# Patient Record
Sex: Female | Born: 1950
Health system: Southern US, Community
[De-identification: ages and names within clinical notes are randomized; demographics above are authoritative.]

## PROBLEM LIST (undated history)

## (undated) DIAGNOSIS — E7800 Pure hypercholesterolemia, unspecified: Secondary | ICD-10-CM

## (undated) DIAGNOSIS — F329 Major depressive disorder, single episode, unspecified: Secondary | ICD-10-CM

## (undated) DIAGNOSIS — E039 Hypothyroidism, unspecified: Secondary | ICD-10-CM

## (undated) DIAGNOSIS — F32A Depression, unspecified: Secondary | ICD-10-CM

## (undated) DIAGNOSIS — IMO0001 Reserved for inherently not codable concepts without codable children: Secondary | ICD-10-CM

## (undated) DIAGNOSIS — K219 Gastro-esophageal reflux disease without esophagitis: Secondary | ICD-10-CM

## (undated) DIAGNOSIS — I10 Essential (primary) hypertension: Secondary | ICD-10-CM

## (undated) DIAGNOSIS — R87810 Cervical high risk human papillomavirus (HPV) DNA test positive: Principal | ICD-10-CM

## (undated) DIAGNOSIS — M1990 Unspecified osteoarthritis, unspecified site: Secondary | ICD-10-CM

## (undated) DIAGNOSIS — Z8489 Family history of other specified conditions: Secondary | ICD-10-CM

## (undated) DIAGNOSIS — F419 Anxiety disorder, unspecified: Secondary | ICD-10-CM

## (undated) HISTORY — DX: Pure hypercholesterolemia, unspecified: E78.00

## (undated) HISTORY — DX: Cervical high risk human papillomavirus (HPV) DNA test positive: R87.810

## (undated) HISTORY — DX: Gastro-esophageal reflux disease without esophagitis: K21.9

## (undated) HISTORY — PX: HERNIA REPAIR: SHX51

## (undated) HISTORY — DX: Depression, unspecified: F32.A

## (undated) HISTORY — DX: Anxiety disorder, unspecified: F41.9

## (undated) HISTORY — DX: Reserved for inherently not codable concepts without codable children: IMO0001

## (undated) HISTORY — DX: Major depressive disorder, single episode, unspecified: F32.9

---

## 1999-04-29 ENCOUNTER — Ambulatory Visit (HOSPITAL_BASED_OUTPATIENT_CLINIC_OR_DEPARTMENT_OTHER): Admission: RE | Admit: 1999-04-29 | Discharge: 1999-04-29 | Payer: Self-pay | Admitting: General Surgery

## 2001-02-23 ENCOUNTER — Ambulatory Visit (HOSPITAL_COMMUNITY): Admission: RE | Admit: 2001-02-23 | Discharge: 2001-02-23 | Payer: Self-pay | Admitting: Obstetrics and Gynecology

## 2001-02-23 ENCOUNTER — Encounter: Payer: Self-pay | Admitting: Obstetrics and Gynecology

## 2001-06-07 ENCOUNTER — Other Ambulatory Visit: Admission: RE | Admit: 2001-06-07 | Discharge: 2001-06-07 | Payer: Self-pay | Admitting: Obstetrics and Gynecology

## 2002-08-23 ENCOUNTER — Ambulatory Visit (HOSPITAL_COMMUNITY): Admission: RE | Admit: 2002-08-23 | Discharge: 2002-08-23 | Payer: Self-pay | Admitting: Pulmonary Disease

## 2003-05-14 ENCOUNTER — Encounter (HOSPITAL_COMMUNITY): Admission: RE | Admit: 2003-05-14 | Discharge: 2003-06-13 | Payer: Self-pay | Admitting: Pulmonary Disease

## 2003-05-30 ENCOUNTER — Ambulatory Visit (HOSPITAL_COMMUNITY): Admission: RE | Admit: 2003-05-30 | Discharge: 2003-05-30 | Payer: Self-pay | Admitting: *Deleted

## 2003-06-06 ENCOUNTER — Ambulatory Visit (HOSPITAL_COMMUNITY): Admission: RE | Admit: 2003-06-06 | Discharge: 2003-06-06 | Payer: Self-pay | Admitting: *Deleted

## 2003-09-10 ENCOUNTER — Ambulatory Visit (HOSPITAL_COMMUNITY): Admission: RE | Admit: 2003-09-10 | Discharge: 2003-09-10 | Payer: Self-pay | Admitting: Obstetrics and Gynecology

## 2004-10-12 HISTORY — PX: COLONOSCOPY: SHX174

## 2004-11-13 ENCOUNTER — Ambulatory Visit (HOSPITAL_COMMUNITY): Admission: RE | Admit: 2004-11-13 | Discharge: 2004-11-13 | Payer: Self-pay | Admitting: Obstetrics and Gynecology

## 2005-01-02 ENCOUNTER — Ambulatory Visit: Payer: Self-pay | Admitting: Internal Medicine

## 2005-01-02 ENCOUNTER — Ambulatory Visit (HOSPITAL_COMMUNITY): Admission: RE | Admit: 2005-01-02 | Discharge: 2005-01-02 | Payer: Self-pay | Admitting: Internal Medicine

## 2006-01-26 ENCOUNTER — Ambulatory Visit (HOSPITAL_COMMUNITY): Admission: RE | Admit: 2006-01-26 | Discharge: 2006-01-26 | Payer: Self-pay | Admitting: Obstetrics and Gynecology

## 2006-06-09 ENCOUNTER — Ambulatory Visit: Payer: Self-pay | Admitting: Orthopedic Surgery

## 2006-06-21 ENCOUNTER — Ambulatory Visit: Payer: Self-pay | Admitting: Orthopedic Surgery

## 2007-04-08 ENCOUNTER — Ambulatory Visit (HOSPITAL_COMMUNITY): Admission: RE | Admit: 2007-04-08 | Discharge: 2007-04-08 | Payer: Self-pay | Admitting: Pulmonary Disease

## 2007-10-19 ENCOUNTER — Other Ambulatory Visit: Admission: RE | Admit: 2007-10-19 | Discharge: 2007-10-19 | Payer: Self-pay | Admitting: Obstetrics and Gynecology

## 2008-02-14 ENCOUNTER — Ambulatory Visit: Payer: Self-pay | Admitting: Orthopedic Surgery

## 2008-02-14 DIAGNOSIS — M79609 Pain in unspecified limb: Secondary | ICD-10-CM | POA: Insufficient documentation

## 2008-05-08 ENCOUNTER — Ambulatory Visit (HOSPITAL_COMMUNITY): Admission: RE | Admit: 2008-05-08 | Discharge: 2008-05-08 | Payer: Self-pay | Admitting: Pulmonary Disease

## 2008-06-19 ENCOUNTER — Ambulatory Visit (HOSPITAL_COMMUNITY): Admission: RE | Admit: 2008-06-19 | Discharge: 2008-06-19 | Payer: Self-pay | Admitting: Otolaryngology

## 2008-10-15 ENCOUNTER — Ambulatory Visit (HOSPITAL_COMMUNITY): Admission: RE | Admit: 2008-10-15 | Discharge: 2008-10-15 | Payer: Self-pay | Admitting: Pulmonary Disease

## 2008-11-02 ENCOUNTER — Other Ambulatory Visit: Admission: RE | Admit: 2008-11-02 | Discharge: 2008-11-02 | Payer: Self-pay | Admitting: Obstetrics & Gynecology

## 2009-07-09 ENCOUNTER — Ambulatory Visit (HOSPITAL_COMMUNITY): Admission: RE | Admit: 2009-07-09 | Discharge: 2009-07-09 | Payer: Self-pay | Admitting: Pulmonary Disease

## 2009-12-04 ENCOUNTER — Other Ambulatory Visit: Admission: RE | Admit: 2009-12-04 | Discharge: 2009-12-04 | Payer: Self-pay | Admitting: Obstetrics & Gynecology

## 2010-08-19 ENCOUNTER — Ambulatory Visit (HOSPITAL_COMMUNITY): Admission: RE | Admit: 2010-08-19 | Discharge: 2010-08-19 | Payer: Self-pay | Admitting: Obstetrics and Gynecology

## 2010-11-03 ENCOUNTER — Encounter: Payer: Self-pay | Admitting: Pulmonary Disease

## 2010-11-27 ENCOUNTER — Ambulatory Visit (INDEPENDENT_AMBULATORY_CARE_PROVIDER_SITE_OTHER): Payer: 59 | Admitting: Internal Medicine

## 2010-11-27 ENCOUNTER — Other Ambulatory Visit (INDEPENDENT_AMBULATORY_CARE_PROVIDER_SITE_OTHER): Payer: Self-pay | Admitting: Internal Medicine

## 2010-11-27 DIAGNOSIS — R1310 Dysphagia, unspecified: Secondary | ICD-10-CM

## 2010-11-28 ENCOUNTER — Encounter (HOSPITAL_COMMUNITY): Payer: Self-pay

## 2010-11-28 ENCOUNTER — Ambulatory Visit (HOSPITAL_COMMUNITY)
Admission: RE | Admit: 2010-11-28 | Discharge: 2010-11-28 | Disposition: A | Discharge status: Home / Self Care | Payer: 59 | Source: Ambulatory Visit | Attending: Internal Medicine | Admitting: Internal Medicine

## 2010-11-28 DIAGNOSIS — R1310 Dysphagia, unspecified: Secondary | ICD-10-CM | POA: Insufficient documentation

## 2010-11-28 HISTORY — DX: Essential (primary) hypertension: I10

## 2010-12-16 ENCOUNTER — Other Ambulatory Visit: Payer: Self-pay | Admitting: Obstetrics & Gynecology

## 2010-12-16 ENCOUNTER — Other Ambulatory Visit (HOSPITAL_COMMUNITY)
Admission: RE | Admit: 2010-12-16 | Discharge: 2010-12-16 | Disposition: A | Discharge status: Home / Self Care | Payer: 59 | Source: Ambulatory Visit | Attending: Obstetrics & Gynecology | Admitting: Obstetrics & Gynecology

## 2010-12-16 DIAGNOSIS — Z01419 Encounter for gynecological examination (general) (routine) without abnormal findings: Secondary | ICD-10-CM | POA: Insufficient documentation

## 2010-12-17 ENCOUNTER — Encounter (HOSPITAL_BASED_OUTPATIENT_CLINIC_OR_DEPARTMENT_OTHER): Payer: 59 | Admitting: Internal Medicine

## 2010-12-17 ENCOUNTER — Ambulatory Visit (HOSPITAL_COMMUNITY)
Admission: RE | Admit: 2010-12-17 | Discharge: 2010-12-17 | Disposition: A | Discharge status: Home / Self Care | Payer: 59 | Source: Ambulatory Visit | Attending: Internal Medicine | Admitting: Internal Medicine

## 2010-12-17 DIAGNOSIS — K449 Diaphragmatic hernia without obstruction or gangrene: Secondary | ICD-10-CM | POA: Insufficient documentation

## 2010-12-17 DIAGNOSIS — Z79899 Other long term (current) drug therapy: Secondary | ICD-10-CM | POA: Insufficient documentation

## 2010-12-17 DIAGNOSIS — E119 Type 2 diabetes mellitus without complications: Secondary | ICD-10-CM | POA: Insufficient documentation

## 2010-12-17 DIAGNOSIS — R1310 Dysphagia, unspecified: Secondary | ICD-10-CM | POA: Insufficient documentation

## 2010-12-17 DIAGNOSIS — K222 Esophageal obstruction: Secondary | ICD-10-CM | POA: Insufficient documentation

## 2010-12-17 DIAGNOSIS — I10 Essential (primary) hypertension: Secondary | ICD-10-CM | POA: Insufficient documentation

## 2010-12-18 LAB — GLUCOSE, CAPILLARY: Glucose-Capillary: 116 mg/dL — ABNORMAL HIGH (ref 70–99)

## 2011-01-12 NOTE — Consult Note (Signed)
[name], [name]           ACCOUNT NO.:  [id number]  MEDICAL RECORD NO.:  [id number]           PATIENT TYPE:  LOCATION:Office Visit                                 FACILITY:  PHYSICIAN:  Lionel December, M.D.    DATE OF BIRTH:  1951-06-13  DATE OF CONSULTATION: DATE OF DISCHARGE:                                CONSULTATION   REASON FOR CONSULTATION:  Dysphagia.  HISTORY OF PRESENT ILLNESS:  [name] is a 60 year old female referred to our office by Dr. Juanetta Gosling for dysphagia.  She states that breads and sandwiches or giving her problems.  They are lodging in her esophagus.  She is noticing this about once a week.  Meats and pills are not lodging.  She does state that she has a habit of coughing her food back up. She denies any acid reflux.  In September 2009, she underwent barium swallow for dysphagia, which revealed a small hiatal hernia, GE reflux, and no stricture.  Her last colonoscopy was in 2006, which revealed small external hemorrhoids, otherwise normal colonoscopy.  LABORATORY DATA:  Sodium 138, potassium 4.4, chloride 101, glucose 108, BUN 10, creatinine 0.58, total bilirubin was 0.7, direct was 0.2, indirect 0.5, ALP 77, AST 16, ALT 16, total protein 6.5, albumin 4.2, and her hemoglobin A1c was 6.0.  She is allergic to PENICILLIN.  HOME MEDICATIONS: 1. Glyburide/metformin 5/500 twice a day. 2. Avapro one a day. 3. Lorazepam 2 mg as needed. 4. Bupropion 150 mg a day. 5. Benzonatate 100 mg as needed. 6. Hydrochlorothiazide 25 mg a day. 7. Lipitor 40 mg a day. 8. Victoza 18 units in the morning.  SURGERIES:  She has had a hernia repair 20 years ago.  She had a tonsillectomy, and she has had some precancerous skin lesions removed.  MEDICAL HISTORY:  She has been a diabetic to 15 years.  She has hypertension and acid reflux.  FAMILY HISTORY:  Mother is alive with diabetes in good health.  Father is deceased from prostate cancer.  One sister who  recently had an atrial valve repair.  She has three brothers and all have arthritis and hypertension.  She is married.  She is retired from [employer], site. She occasionally smokes about twice a week.  No alcohol and no children.  OBJECTIVE:  VITAL SIGNS:  Her weight is 216, her height is 5 feet 6 inches, her temperature is 97.3,  her blood pressure is 136/70, her pulse is 70. HEENT:  She has natural teeth.  Her oral mucosa is moist.  There are no lesions.  Her conjunctivae are pink.  Sclerae are anicteric.  Thyroid is normal.  There is no cervical lymphadenopathy. LUNGS:  Clear. HEART:  Regular rate and rhythm. ABDOMEN:  Soft, obese.  Bowel sounds are positive.  No masses and no tenderness.  ASSESSMENT:  [name] is a 60 year old female presenting with dysphagia to solids and particularly breads.  She has had this problem  in the past.   RECOMMENDATIONS:  We will schedule a barium pill study in the near future.  We will start her on omeprazole 40 mg daily.  If her barium swallow  is normal and she is still having symptoms, I have advised her that Dr.Cicero Noy  would proceed with an EGD.  Thank you for allowing [place] to participate in her care.    ______________________________ Dorene Ar, NP   ______________________________ Lionel December, M.D.    TS/MEDQ  D:  11/27/2010  T:  11/28/2010  Job:  [id number]  cc:   Dr. Juanetta Gosling  Electronically Signed by Dorene Ar PA on 12/17/2010 10:37:50 AM Electronically Signed by Lionel December M.D. on 01/12/2011 10:00:06 AM

## 2011-01-12 NOTE — Op Note (Signed)
  [name], [name]           ACCOUNT NO.:  [id number]  MEDICAL RECORD NO.:  [id number]           PATIENT TYPE:  O  LOCATION:  DAYP                          FACILITY:  [facility]  PHYSICIAN:  Lionel December, M.D.    DATE OF BIRTH:  May 21, 1951  DATE OF PROCEDURE:  12/17/2010 DATE OF DISCHARGE:                              OPERATIVE REPORT   PROCEDURE:  Esophagogastroduodenoscopy with esophageal dilation.  INDICATION:  [name] is 60 year old Caucasian female who has been experiencing intermittent solid food dysphagia over the last few months. She denies heartburn, odynophagia.  She is undergoing diagnostic/therapeutic EGD.  Procedures were reviewed with the patient. Informed consent was obtained.  MEDS FOR CONSCIOUS SEDATION:  Cetacaine spray for oropharyngeal topical anesthesia, Demerol 50 mg IV, Versed 7 mg IV in divided dose.  FINDINGS:  Procedure performed in endoscopy suite.  The patient's vital signs and O2 sat were monitored during the procedure and remained stable.  The patient was placed in left lateral recumbent position and Pentax videoscope was passed through oropharynx without any difficulty into esophagus.  Mucosa of the esophagus was normal.  Schatzki's ring noted.  GE junction was located at 34 cm from the incisors and hiatus was at 36.  No erosions were noted.  Stomach.  It was empty and distended very well by insufflation.  Folds of proximal stomach are normal.  Examination of mucosa at body, antrum, pyloric channel, as well as angularis, fundus, and cardia was normal.  Duodenum.  Bulbar mucosa was normal.  Scope was passed in second part of the duodenum where mucosa and folds were normal.  Endoscope was withdrawn.  Esophageal dilation was performed by passing 54-French Maloney dilator to full insertion.  As the dilator was withdrawn, endoscope was passed again.  There was a small linear tear to cervical esophagus, but the ring was intact and therefore  disrupted with four quadrant biopsy.  No tissue was saved.  Stomach was decompressed and endoscope was withdrawn. The patient tolerated the procedure well.  FINAL DIAGNOSES: 1. Schatzki's ring. 2. A 2-cm size sliding hiatal hernia.  Esophagus dilated by passing 54-French Maloney dilator resulting in linear mucosal disruption at cervical esophagus, but ring was intact and therefore disrupted with focal biopsy as described above.  RECOMMENDATIONS: 1. The patient advised to chew her food thoroughly before she     swallows. 2. She will call us with a progress report in 1 week.     Lionel December, M.D.     NR/MEDQ  D:  12/17/2010  T:  12/17/2010  Job:  [id number]  cc:   Dr. Juanetta Gosling  Electronically Signed by Lionel December M.D. on 01/12/2011 10:00:26 AM

## 2011-02-27 NOTE — Op Note (Signed)
[name], [name]           ACCOUNT NO.:  [id number]   MEDICAL RECORD NO.:  [id number]          PATIENT TYPE:  AMB   LOCATION:  DAY                           FACILITY:  [facility]   PHYSICIAN:  Lionel December, M.D.    DATE OF BIRTH:  November 04, 1950   DATE OF PROCEDURE:  01/02/2005  DATE OF DISCHARGE:                                 OPERATIVE REPORT   PROCEDURE:  Total colonoscopy.   INDICATION:  [name] is a 60 year old Caucasian female who is here  for screening colonoscopy. Family history is negative for CRC. Procedure  risks were reviewed the patient, and informed consent was obtained.   PREMEDICATION:  Demerol 25 mg IV, Versed 5 mg IV.   FINDINGS:  Procedure performed in endoscopy suite. The patient's vital signs  and O2 saturation were monitored during procedure and remained stable. The  patient was placed left lateral position and rectal examination performed.  No abnormality noted on external or digital exam. Olympus videoscope was  placed in rectum and restoration into sigmoid colon and beyond. Preparation  was excellent, except she had some stool in the hepatic flexure which was  washed away. Scope was passed to cecum which was identified by appendiceal  orifice and ileocecal valve. Short segment of TI was also examined and was  normal. Pictures taken for the record. As the scope was withdrawn, colonic  mucosa was examined for the second time and was normal throughout. Rectal  mucosa similarly was normal. Scope was retroflexed to examine anorectal  junction, and small hemorrhoids noted below the dentate line. Endoscope was  straightened and withdrawn. The patient tolerated the procedure well.   FINAL DIAGNOSIS:  Small external hemorrhoids, otherwise normal colonoscopy.   RECOMMENDATIONS:  1.  She will resume her usual diet.  2.  She should continue yearly Hemoccults and consider next screening exam      in 10 years from now.      NR/MEDQ  D:  01/02/2005  T:   01/02/2005  Job:  [id number]

## 2011-02-27 NOTE — Procedures (Signed)
   [name], [name]                       ACCOUNT NO.:  [id number]   MEDICAL RECORD NO.:  [id number]                   PATIENT TYPE:  PREC   LOCATION:                                       FACILITY:   PHYSICIAN:  Edward L. Juanetta Gosling, M.D.             DATE OF BIRTH:   DATE OF PROCEDURE:  05/14/2003  DATE OF DISCHARGE:                                    STRESS TEST   PROCEDURE:  Stress test.   [name] has been having chest discomfort.  She also has a history of  hypertension.  She is undergoing graded exercise testing to rule out  ischemic cardiac disease.  There are no contraindications to graded exercise  testing.   DESCRIPTION OF PROCEDURE:  [name] exercised for 7 minutes and 13  seconds on the Bruce Protocol reaching, and sustaining, 10.1 METS.   Cardiolite was injected per protocol.  Her blood pressure response to  exercise was normal.  She had no symptoms during exercise.  She has no  electrocardiographic changes suggestive of inducible ischemia.   IMPRESSION:  1. Good exercise tolerance.  2. Normal blood pressure response to exercise.  3. No evidence of inducible ischemia.  4. No symptoms on exercise.  5. Cardiolite images are pending.                                               Edward L. Juanetta Gosling, M.D.    ELH/MEDQ  D:  05/14/2003  T:  05/14/2003  Job:  [id number]

## 2011-02-27 NOTE — Cardiovascular Report (Signed)
NAME:  [name], FONT                     ACCOUNT NO.:  [id number]   MEDICAL RECORD NO.:  [id number]                   PATIENT TYPE:  OIB   LOCATION:  2876                                 FACILITY:  [facility]   PHYSICIAN:  Vida Roller, M.D.                DATE OF BIRTH:  07-14-1951   DATE OF PROCEDURE:  06/06/2003  DATE OF DISCHARGE:  06/06/2003                              CARDIAC CATHETERIZATION   REFERRING PHYSICIAN:  Ramon Dredge L. Juanetta Gosling, M.D.   REASON FOR EVALUATION:  This is a 60 year old woman with hypertension and  hyperlipidemia who presents with atypical chest discomfort.  Had a perfusion  study which showed anterior ischemia which could not be ruled out for breast  attenuation and she was referred for elective heart catheterization via the  radial approach.   PROCEDURE PERFORMED:  1. Left heart catheterization.  2. Selective coronary angiography.  3. Left ventriculography.   DETAILS OF THE PROCEDURE:  After obtaining informed consent, the patient was  brought to the cardiac catheterization laboratory in the fasting state.  There she was prepped and draped in the usual manner and local anesthetic  was obtained over the right wrist using 1% lidocaine without epinephrine.  The right radial artery was cannulated using the modified Seldinger  technique with a 6-French 10 cm sheath and left heart catheterization was  performed using a 6-French Judkins left #3.5 and a 6-French Judkins right  #4.0.  Pigtail catheter was used for the left ventriculogram which was  imaged in the 30 degree RAO view.  At the conclusion of the procedure the  catheters were removed.  The sheath was removed.  A Radstat device was  placed for hemostasis.  The patient was moved back to the cardiology holding  area in good condition.  Total fluoroscopic time was 15.7 minutes.  Total  ionized contrast was 100 mL.   RESULTS:  Aortic pressure was 110/70 with a mean arterial pressure 85.   Left  ventricular pressure was 105/8 with an end-diastolic pressure of 16  mmHg.   CORONARY ANGIOGRAPHY:  The left main coronary artery is a normal sized  artery with no angiographic disease.   The left anterior descending coronary artery is a large transapical vessel  with three diagonal branches all of which are moderate caliber.  There is no  angiographic disease.   There is a ramus intermedius artery which is small and has no angiographic  disease.   The left circumflex coronary artery is a moderate caliber vessel with two  obtuse marginals in the posterolateral branch all of which are free of  disease.   The right coronary artery is a large dominant vessel with a small  posterolateral branch and a moderate caliber posterior descending coronary  artery, but is completely free of disease.   LEFT VENTRICULOGRAM:  Left ventriculogram reveals an ejection fraction of  60% with no wall motion abnormalities, no mitral regurgitation.  IMPRESSION:  1. Normal coronaries.  2. Normal left ventricular function.   PLAN:  Medical therapy and risk factor modification.                                               Vida Roller, M.D.    JH/MEDQ  D:  06/06/2003  T:  06/07/2003  Job:  [id number]

## 2011-04-21 ENCOUNTER — Ambulatory Visit (HOSPITAL_COMMUNITY)
Admission: RE | Admit: 2011-04-21 | Discharge: 2011-04-21 | Disposition: A | Discharge status: Home / Self Care | Payer: 59 | Source: Ambulatory Visit | Attending: Pulmonary Disease | Admitting: Pulmonary Disease

## 2011-04-21 ENCOUNTER — Other Ambulatory Visit (HOSPITAL_COMMUNITY): Payer: Self-pay | Admitting: Pulmonary Disease

## 2011-04-21 DIAGNOSIS — M899 Disorder of bone, unspecified: Secondary | ICD-10-CM | POA: Insufficient documentation

## 2011-04-21 DIAGNOSIS — M25559 Pain in unspecified hip: Secondary | ICD-10-CM | POA: Insufficient documentation

## 2011-04-21 DIAGNOSIS — R52 Pain, unspecified: Secondary | ICD-10-CM

## 2011-11-06 ENCOUNTER — Other Ambulatory Visit (HOSPITAL_COMMUNITY): Payer: Self-pay | Admitting: Pulmonary Disease

## 2011-11-06 DIAGNOSIS — Z139 Encounter for screening, unspecified: Secondary | ICD-10-CM

## 2011-11-10 ENCOUNTER — Ambulatory Visit (HOSPITAL_COMMUNITY)
Admission: RE | Admit: 2011-11-10 | Discharge: 2011-11-10 | Disposition: A | Discharge status: Home / Self Care | Payer: BC Managed Care – PPO | Source: Ambulatory Visit | Attending: Pulmonary Disease | Admitting: Pulmonary Disease

## 2011-11-10 DIAGNOSIS — Z139 Encounter for screening, unspecified: Secondary | ICD-10-CM

## 2011-11-10 DIAGNOSIS — Z1231 Encounter for screening mammogram for malignant neoplasm of breast: Secondary | ICD-10-CM | POA: Insufficient documentation

## 2011-12-23 ENCOUNTER — Other Ambulatory Visit (HOSPITAL_COMMUNITY)
Admission: RE | Admit: 2011-12-23 | Discharge: 2011-12-23 | Disposition: A | Discharge status: Home / Self Care | Payer: BC Managed Care – PPO | Source: Ambulatory Visit | Attending: Obstetrics & Gynecology | Admitting: Obstetrics & Gynecology

## 2011-12-23 ENCOUNTER — Other Ambulatory Visit: Payer: Self-pay | Admitting: Obstetrics & Gynecology

## 2011-12-23 DIAGNOSIS — Z01419 Encounter for gynecological examination (general) (routine) without abnormal findings: Secondary | ICD-10-CM | POA: Insufficient documentation

## 2012-02-02 ENCOUNTER — Other Ambulatory Visit (HOSPITAL_COMMUNITY): Payer: Self-pay | Admitting: Pulmonary Disease

## 2012-02-02 DIAGNOSIS — M545 Low back pain, unspecified: Secondary | ICD-10-CM

## 2012-02-02 DIAGNOSIS — M5410 Radiculopathy, site unspecified: Secondary | ICD-10-CM

## 2012-02-03 ENCOUNTER — Ambulatory Visit (HOSPITAL_COMMUNITY)
Admission: RE | Admit: 2012-02-03 | Discharge: 2012-02-03 | Disposition: A | Discharge status: Home / Self Care | Payer: BC Managed Care – PPO | Source: Ambulatory Visit | Attending: Pulmonary Disease | Admitting: Pulmonary Disease

## 2012-02-03 DIAGNOSIS — M47817 Spondylosis without myelopathy or radiculopathy, lumbosacral region: Secondary | ICD-10-CM | POA: Insufficient documentation

## 2012-02-03 DIAGNOSIS — M545 Low back pain, unspecified: Secondary | ICD-10-CM

## 2012-02-03 DIAGNOSIS — M79609 Pain in unspecified limb: Secondary | ICD-10-CM | POA: Insufficient documentation

## 2012-02-03 DIAGNOSIS — M5410 Radiculopathy, site unspecified: Secondary | ICD-10-CM

## 2012-02-04 ENCOUNTER — Other Ambulatory Visit: Payer: Self-pay | Admitting: *Deleted

## 2012-02-04 ENCOUNTER — Encounter: Payer: Self-pay | Admitting: Orthopedic Surgery

## 2012-02-04 ENCOUNTER — Ambulatory Visit (INDEPENDENT_AMBULATORY_CARE_PROVIDER_SITE_OTHER): Payer: BC Managed Care – PPO | Admitting: Orthopedic Surgery

## 2012-02-04 VITALS — BP 96/64 | Ht 65.0 in | Wt 213.0 lb

## 2012-02-04 DIAGNOSIS — M5126 Other intervertebral disc displacement, lumbar region: Secondary | ICD-10-CM

## 2012-02-04 DIAGNOSIS — M47817 Spondylosis without myelopathy or radiculopathy, lumbosacral region: Secondary | ICD-10-CM

## 2012-02-04 DIAGNOSIS — M48061 Spinal stenosis, lumbar region without neurogenic claudication: Secondary | ICD-10-CM

## 2012-02-04 DIAGNOSIS — M549 Dorsalgia, unspecified: Secondary | ICD-10-CM

## 2012-02-04 NOTE — Progress Notes (Signed)
CONSULT REQUEST BY DR Juanetta Gosling Subjective:    [name] is a 61 y.o. female who presents for evaluation of low back pain. The patient has had no prior back problems. Symptoms have been present for 8 months and are gradually worsening.  Onset was related to / precipitated by no known injury. The pain is located in the right lumbar area, right gluteal area or radiating to right leg(s) and radiates to the right hip, right thigh, right lower leg, right foot. The pain is described as aching, burning, stabbing, throbbing and tingling and occurs all day. . Symptoms are exacerbated by lying down, sitting, standing and walking for more than a few minutes. Symptoms are improved by change in body position, heat, ice and narcotic pain medications. She has also tried acetaminophen and rest which provided no symptom relief. She has weakness in the right leg, tingling in the right leg and burning pain in the right leg associated with the back pain. The patient has no "red flag" history indicative of complicated back pain.  The following portions of the patient's history were reviewed and updated as appropriate: allergies, current medications, past family history, past medical history, past social history, past surgical history and problem list.  Review of Systems Pertinent items are noted in HPI.    Objective:   Inspection and palpation: lordosis noted , spinal tenderness noted L5-S1, paraspinal tenderness noted right lower back . Muscle tone and ROM exam: muscle tone normal without spasm, limited range of motion with pain. Straight leg raise: positive at 60 degrees on the right. Neurological: normal DTRs, muscle strength and reflexes.   Imaging: MRI spinal stenosis  Assessment:    Spinal stenosis    Plan:    Natural history and expected course discussed. Questions answered. Neurosurgery referral due to weakness in the right leg .

## 2012-02-04 NOTE — Patient Instructions (Addendum)
Neurosurgery referral for IMPRESSION:  Multilevel lumbar spondylosis and stenosis as described above.  Multilevel facet arthritis and arthrosis. Central stenosis most  pronounced at L4-L5, with bilateral lateral recess stenosis.  MRI REPORT:   L1-L2: Disc degeneration. Right lateral protrusion. Right facet  arthritis with small effusion. Circumferential disc bulges present  with mild central stenosis. Right eccentric broad-based posterior  disc bulge.  L2-L3: Circumferential disc bulge. Central canal shows minimal  narrowing associated with broad-based posterior bulging. Right  greater than left facet arthritis is present. Small right facet  effusion. Left greater than right lateral recess stenosis  associated with bulging disc. Mild left foraminal stenosis.  L3-L4: Degenerated and desiccated disc with shallow broad-based  posterior disc bulge. Right facet arthritis and left facet  arthrosis. Mild central stenosis is multifactorial, associated  with disc, facet hypertrophy and ligamentum flavum redundancy.  Mild bilateral foraminal stenosis is multifactorial.  L4-L5: Severe bilateral facet arthrosis. Moderate central  stenosis. Bilateral subarticular lateral recess stenosis. Mild  symmetric bilateral foraminal stenosis associated with  anterolisthesis, uncoverage of the disc and facet arthrosis. Left  facet arthritis is present with facet effusion and periarticular  edema.  L5-S1: Relative preservation of disc height and signal. Central  canal and foramina are patent. Left facet arthritis and right  facet arthrosis. Left lateral recess stenosis is present  associated with hypertrophic facet changes, potentially affecting  the descending left S1 nerve.  IMPRESSION:  Multilevel lumbar spondylosis and stenosis as described above.  Multilevel facet arthritis and arthrosis. Central stenosis most  pronounced at L4-L5, with bilateral lateral recess stenosis.  Original Report  Authenticated By: Andreas Newport, M.D.    Spinal Stenosis One cause of back pain is spinal stenosis. Stenosis means abnormal narrowing. The spinal canal contains and protects the spinal nerve roots. In spinal stenosis, the spinal canal narrows and pinches the spinal cord and nerves. This causes low back pain and pain in the legs. Stenosis may pinch the nerves that control muscles and sensation in the legs. This leads to pain and abnormal feelings in the leg muscles and areas supplied by those nerves. CAUSES   Spinal stenosis often happens to people as they get older and arthritic boney growths occur in their spinal canal. There is also a loss of the disk height between the bones of the back, which also adds to this problem. Sometimes the problem is present at birth. SYMPTOMS    Pain that is generally worse with activities, particularly standing and walking.   Numbness, tingling, hot or cold feelings, weakness, or a weariness in the legs.   Clumsiness, frequent falling, and a foot-slapping gait, which may come as a result of nerve pressure and muscle weakness.  DIAGNOSIS    Your caregiver may suspect spinal stenosis if you have unusual leg symptoms, such as those previously mentioned.   Your orthopedic surgeon may request special imaging exams, such a computerized magnetic scan (MRI) or computerized X-ray scan (CT) to find out the cause of the problem.  TREATMENT    Sometimes treatments such as postural changes or nonsteroidal anti-inflammatory drugs will relieve the pain.   Nonsteroidal anti-inflammatory medications may help relieve symptoms. These medicines do this by decreasing swelling and inflammation in the nerves.   When stenosis causes severe nerve root compression, conservative treatment may not be enough to maintain a normal lifestyle. Surgery may be recommended to relieve the pressure on affected nerves. In properly selected patients, the results are very good, and patients are  able to  continue a normal lifestyle.  HOME CARE INSTRUCTIONS    Flexing the spine by leaning forward while walking may relieve symptoms. Lying with the knees drawn up to the chest may offer some relief. These positions enlarge the space available to the nerves. They may make it easier for stenosis sufferers to walk longer distances.   Rest, followed by gradually resuming activity, also can help.   Aerobic activity, such as bicycling or swimming, is often recommended.   Losing weight can also relieve some of the load on the spine.   Application of warm or cold compresses to the area of pain can be helpful.  SEEK MEDICAL CARE IF:    The periods of relief between episodes of pain become shorter and shorter.   You experience pain that radiates down your leg, even when you are not standing or walking.  SEEK IMMEDIATE MEDICAL CARE IF:    You have a loss of bowel or bladder control.   You have a sudden loss of feeling in your legs.   You suddenly cannot move your legs.  Document Released: 12/19/2003 Document Revised: 09/17/2011 Document Reviewed: 02/13/2010 [facility] Patient Information 2012 [place], [place].

## 2012-02-05 ENCOUNTER — Encounter: Payer: Self-pay | Admitting: *Deleted

## 2012-02-05 NOTE — Progress Notes (Signed)
Patient ID: [name], female   DOB: 07-May-1951, 61 y.o.   MRN: [id number] Referral has been sent to [facility] dr Venetia Maxon for eval and treat

## 2012-02-15 ENCOUNTER — Telehealth: Payer: Self-pay | Admitting: Orthopedic Surgery

## 2012-02-15 NOTE — Telephone Encounter (Signed)
Patient states (1) she has seen the neurosurgeon, per referral; states she saw Dr. Venetia Maxon and that he recommended injection, and that he was referring her back to Dr. Romeo Apple.  No report as of yet. (2) She states also that she has been taking the Neurontin (generic) that Dr. Romeo Apple had prescribed, however, she has started to increase the dosage of 100 mg 3x per day, to 200 mg 3x per day on her own, as she was not getting relief from the 100 mg dosage.  She states that this higher dosage of Neurontin has allowed her to decrease her amount of Hydrocodone.  Please advise. Patient's ph# is [phone].

## 2012-02-15 NOTE — Telephone Encounter (Signed)
neurontin dose ok   Does she want injection ?  Please advise Dr Romeo Apple doesn't follow back pain but willing to order injections if she wants one but she should call Dr Venetia Maxon if things get worse

## 2012-02-16 MED ORDER — GABAPENTIN 100 MG PO CAPS: ORAL_CAPSULE | ORAL | Status: DC

## 2012-02-16 NOTE — Telephone Encounter (Addendum)
02/17/12 Called patient and relayed.  She again mentioned about what Dr. Venetia Maxon relayed to her in that she needs to see Dr. Romeo Apple again regarding hip.  Contacted their office to request copy of Dr. Fredrich Birks last office note.   Patient said, regarding injections, Dr. Venetia Maxon may also be working on that for her.  Waiting for his notes to make any further referral for injection.   Patient said, regarding Neurontin, she would be requesting refill soon from [facility].  Copying in nurse in event dosage instructions need to be update per Dr. Mort Sawyers note.

## 2012-02-16 NOTE — Telephone Encounter (Signed)
Refill sent with new directions 

## 2012-11-03 ENCOUNTER — Other Ambulatory Visit (HOSPITAL_COMMUNITY): Payer: Self-pay | Admitting: Orthopedic Surgery

## 2012-11-03 NOTE — Patient Instructions (Addendum)
20 [name]  11/03/2012   Your procedure is scheduled on: 11/15/12 TUESDAY   Report to [facility] at   0500    AM.  Call this number if you have problems the morning of surgery: [phone]       Remember: DO NOT TAKE ANY DIABETES MEDICINE MORNING OF SURGERY  Do not eat food  Or drink :After Midnight. Monday NIGHT   Take these medicines the morning of surgery with A SIP OF WATER: LORAZEPAM, FLEXARIL  IF NEEDED   .  Contacts, dentures or partial plates can not be worn to surgery  Leave suitcase in the car. After surgery it may be brought to your room.  For patients admitted to the hospital, checkout time is 11:00 AM day of  discharge.             SPECIAL INSTRUCTIONS- SEE [facility] PREPARING FOR SURGERY INSTRUCTION SHEET-     DO NOT WEAR JEWELRY, LOTIONS, POWDERS, OR PERFUMES.  WOMEN-- DO NOT SHAVE LEGS OR UNDERARMS FOR 12 HOURS BEFORE SHOWERS. MEN MAY SHAVE FACE.  Patients discharged the day of surgery will not be allowed to drive home. IF going home the day of surgery, you must have a driver and someone to stay with you for the first 24 hours  Name and phone number of your driver:    admission      [facility]-  HUSBAND                                                              Please read over the following fact sheets that you were given: MRSA Information, Incentive Spirometry Sheet, Blood Transfusion Sheet  Information                                                                                   [name]  PST 336  [phone]                 FAILURE TO FOLLOW THESE INSTRUCTIONS MAY RESULT IN  CANCELLATION   OF YOUR SURGERY                                                  Patient Signature _____________________________

## 2012-11-03 NOTE — Progress Notes (Signed)
Clearance Dr Juanetta Gosling on chart

## 2012-11-04 ENCOUNTER — Encounter (HOSPITAL_COMMUNITY): Payer: Self-pay | Admitting: Pharmacy Technician

## 2012-11-04 ENCOUNTER — Encounter (HOSPITAL_COMMUNITY): Payer: Self-pay

## 2012-11-04 ENCOUNTER — Encounter (HOSPITAL_COMMUNITY)
Admission: RE | Admit: 2012-11-04 | Discharge: 2012-11-04 | Disposition: A | Discharge status: Home / Self Care | Payer: BC Managed Care – PPO | Source: Ambulatory Visit | Attending: Orthopedic Surgery | Admitting: Orthopedic Surgery

## 2012-11-04 ENCOUNTER — Ambulatory Visit (HOSPITAL_COMMUNITY)
Admission: RE | Admit: 2012-11-04 | Discharge: 2012-11-04 | Disposition: A | Discharge status: Home / Self Care | Payer: BC Managed Care – PPO | Source: Ambulatory Visit | Attending: Orthopedic Surgery | Admitting: Orthopedic Surgery

## 2012-11-04 DIAGNOSIS — M169 Osteoarthritis of hip, unspecified: Secondary | ICD-10-CM | POA: Insufficient documentation

## 2012-11-04 DIAGNOSIS — Z01810 Encounter for preprocedural cardiovascular examination: Secondary | ICD-10-CM | POA: Insufficient documentation

## 2012-11-04 DIAGNOSIS — Z01812 Encounter for preprocedural laboratory examination: Secondary | ICD-10-CM | POA: Insufficient documentation

## 2012-11-04 DIAGNOSIS — M1610 Unilateral primary osteoarthritis, unspecified hip: Secondary | ICD-10-CM | POA: Insufficient documentation

## 2012-11-04 HISTORY — DX: Family history of other specified conditions: Z84.89

## 2012-11-04 HISTORY — DX: Gastro-esophageal reflux disease without esophagitis: K21.9

## 2012-11-04 HISTORY — DX: Unspecified osteoarthritis, unspecified site: M19.90

## 2012-11-04 LAB — URINALYSIS, ROUTINE W REFLEX MICROSCOPIC
Bilirubin Urine: NEGATIVE
Hgb urine dipstick: NEGATIVE
Ketones, ur: NEGATIVE mg/dL
Protein, ur: NEGATIVE mg/dL
Urobilinogen, UA: 0.2 mg/dL (ref 0.0–1.0)

## 2012-11-04 LAB — BASIC METABOLIC PANEL
BUN: 14 mg/dL (ref 6–23)
Calcium: 9.7 mg/dL (ref 8.4–10.5)
GFR calc Af Amer: >90 mL/min (ref >90)
GFR calc non Af Amer: >90 mL/min (ref >90)
Glucose, Bld: 99 mg/dL (ref 70–99)
Sodium: 134 mEq/L — ABNORMAL LOW (ref 135–145)

## 2012-11-04 LAB — PROTIME-INR: Prothrombin Time: 11.8 seconds (ref 11.6–15.2)

## 2012-11-04 LAB — CBC
MCH: 28.2 pg (ref 26.0–34.0)
MCHC: 33.3 g/dL (ref 30.0–36.0)
Platelets: 286 10*3/uL (ref 150–400)

## 2012-11-04 NOTE — Progress Notes (Signed)
Faxed pos mrsa/staph Pcr TO d[facility] oLIN THROUGH epic, LEFT MESSAGE FOR PATIENT TO begin Mupirocin today and call back Monday for verification of message

## 2012-11-07 NOTE — Progress Notes (Signed)
Verified with patient the positive MRSA nasal swab results- states started over the weekend but didn't follow instructions as I gave her. Reinforced the importance of treatment for this germ. States she lost the shower instructions and pre op teaching sheet. Instructed her I would leave copy at PST desk for her to pick up today

## 2012-11-08 NOTE — Progress Notes (Signed)
Ekg reviewed by Dr Reginia Forts- "OK"

## 2012-11-13 NOTE — H&P (Signed)
TOTAL HIP ADMISSION H&P  Patient is admitted for right total hip arthroplasty, anterior approach.  Subjective:  Chief Complaint: Right hip OA / pain  HPI: [name], 62 y.o. female, has a history of pain and functional disability in the right hip(s) due to arthritis and patient has failed non-surgical conservative treatments for greater than 12 weeks to include NSAID's and/or analgesics, corticosteriod injections and activity modification.  Onset of symptoms was gradual starting 4 years ago with gradually worsening course since that time.The patient noted no past surgery on the right hip(s).  Patient currently rates pain in the right hip at 8 out of 10 with activity. Patient has night pain, worsening of pain with activity and weight bearing, trendelenberg gait, pain that interfers with activities of daily living and pain with passive range of motion. Patient has evidence of periarticular osteophytes and joint space narrowing by imaging studies. This condition presents safety issues increasing the risk of falls.  There is no current active infection.  Risks, benefits and expectations were discussed with the patient. Patient understand the risks, benefits and expectations and wishes to proceed with surgery.   D/C Plans:   Home with HHPT  Post-op Meds:  Rx given for ASA, Flexeril, Iron, Colace and MiraLax  Tranexamic Acid:   To be given  Decadron:    Not to be given - Flexeril   FYI:   Flexeril, not Robaxin, states that Robaxin doesn't work.   Patient Active Problem List   Diagnosis Date Noted  . Spinal stenosis of lumbar region 02/04/2012  . Herniated lumbar intervertebral disc 02/04/2012  . Back pain 02/04/2012  . Lumbosacral spondylosis without myelopathy 02/04/2012  . Lumbosacral stenosis without neurogenic claudication 02/04/2012  . FOOT PAIN, LEFT 02/14/2008   Past Medical History  Diagnosis Date  . Diabetes mellitus   . Hypertension   . High cholesterol   . Anxiety and  depression   . Reflux   . Family history of anesthesia complication     nausea and vomiting  . Arthritis   . GERD (gastroesophageal reflux disease)     Past Surgical History  Procedure Date  . Hernia repair      Allergies  Allergen Reactions  . Penicillins Rash    History  Substance Use Topics  . Smoking status: Never Smoker   . Smokeless tobacco: Never Used  . Alcohol Use: No     Comment: none x 5 years    Family History  Problem Relation Age of Onset  . Heart disease    . Arthritis    . Cancer    . Diabetes       Review of Systems  Constitutional: Negative.   HENT: Negative.   Eyes: Negative.   Respiratory: Negative.   Cardiovascular: Negative.   Gastrointestinal: Positive for heartburn.  Genitourinary: Positive for frequency.  Musculoskeletal: Positive for myalgias and joint pain.  Skin: Negative.   Neurological: Negative.   Endo/Heme/Allergies: Positive for environmental allergies.  Psychiatric/Behavioral: Negative.     Objective:  Physical Exam  Constitutional: She is oriented to person, place, and time. She appears well-developed and well-nourished.  HENT:  Head: Normocephalic and atraumatic.  Mouth/Throat: Oropharynx is clear and moist.  Eyes: Pupils are equal, round, and reactive to light.  Neck: Neck supple. No JVD present. No tracheal deviation present. No thyromegaly present.  Cardiovascular: Normal rate, regular rhythm and intact distal pulses.   Respiratory: Effort normal and breath sounds normal. No stridor. No respiratory distress. She has  no wheezes.  GI: Soft. There is no tenderness. There is no guarding.  Musculoskeletal:       Right hip: She exhibits decreased range of motion, decreased strength, tenderness and bony tenderness. She exhibits no swelling, no deformity and no laceration.  Lymphadenopathy:    She has no cervical adenopathy.  Neurological: She is alert and oriented to person, place, and time.  Skin: Skin is warm and dry.   Psychiatric: She has a normal mood and affect.    Labs:  Estimated Body mass index is 35.44 kg/(m^2) as calculated from the following:   Height as of 02/04/12: 5\' 5" (1.651 m).   Weight as of 02/04/12: 213 lb(96.616 kg).   Imaging Review Plain radiographs demonstrate severe degenerative joint disease of the right hip(s). The bone quality appears to be good for age and reported activity level.  Assessment/Plan:  End stage arthritis, right hip(s)  The patient history, physical examination, clinical judgement of the provider and imaging studies are consistent with end stage degenerative joint disease of the right hip(s) and total hip arthroplasty is deemed medically necessary. The treatment options including medical management, injection therapy, arthroscopy and arthroplasty were discussed at length. The risks and benefits of total hip arthroplasty were presented and reviewed. The risks due to aseptic loosening, infection, stiffness, dislocation/subluxation,  thromboembolic complications and other imponderables were discussed.  The patient acknowledged the explanation, agreed to proceed with the plan and consent was signed. Patient is being admitted for inpatient treatment for surgery, pain control, PT, OT, prophylactic antibiotics, VTE prophylaxis, progressive ambulation and ADL's and discharge planning.The patient is planning to be discharged home with home health services.    Anastasio Auerbach Molly Savarino   PAC  11/13/2012, 7:27 PM

## 2012-11-14 NOTE — Anesthesia Preprocedure Evaluation (Addendum)
Anesthesia Evaluation  Patient identified by MRN, date of birth, ID band Patient awake    Reviewed: Allergy & Precautions, H&P , NPO status , Patient's Chart, lab work & pertinent test results  Airway Mallampati: II TM Distance: >3 FB Neck ROM: full    Dental No notable dental hx. (+) Teeth Intact and Dental Advisory Given   Pulmonary neg pulmonary ROS,  breath sounds clear to auscultation  Pulmonary exam normal       Cardiovascular hypertension, Pt. on medications Rhythm:regular Rate:Normal     Neuro/Psych negative neurological ROS  negative psych ROS   GI/Hepatic negative GI ROS, Neg liver ROS, GERD-  Medicated and Controlled,  Endo/Other  diabetes, Well Controlled, Type 2, Oral Hypoglycemic AgentsMorbid obesity  Renal/GU negative Renal ROS  negative genitourinary   Musculoskeletal   Abdominal   Peds  Hematology negative hematology ROS (+)   Anesthesia Other Findings   Reproductive/Obstetrics negative OB ROS                          Anesthesia Physical Anesthesia Plan  ASA: III  Anesthesia Plan: General   Post-op Pain Management:    Induction: Intravenous  Airway Management Planned: Oral ETT  Additional Equipment:   Intra-op Plan:   Post-operative Plan: Extubation in OR  Informed Consent: I have reviewed the patients History and Physical, chart, labs and discussed the procedure including the risks, benefits and alternatives for the proposed anesthesia with the patient or authorized representative who has indicated his/her understanding and acceptance.   Dental Advisory Given  Plan Discussed with: CRNA and Surgeon  Anesthesia Plan Comments:         Anesthesia Quick Evaluation

## 2012-11-15 ENCOUNTER — Ambulatory Visit (HOSPITAL_COMMUNITY): Payer: BC Managed Care – PPO | Admitting: Anesthesiology

## 2012-11-15 ENCOUNTER — Inpatient Hospital Stay (HOSPITAL_COMMUNITY)
Admission: RE | Admit: 2012-11-15 | Discharge: 2012-11-16 | DRG: 818 | Disposition: A | Discharge status: Home Health | Payer: BC Managed Care – PPO | Source: Ambulatory Visit | Attending: Orthopedic Surgery | Admitting: Orthopedic Surgery

## 2012-11-15 ENCOUNTER — Encounter (HOSPITAL_COMMUNITY): Payer: Self-pay | Admitting: Anesthesiology

## 2012-11-15 ENCOUNTER — Encounter (HOSPITAL_COMMUNITY): Payer: Self-pay | Admitting: *Deleted

## 2012-11-15 ENCOUNTER — Ambulatory Visit (HOSPITAL_COMMUNITY): Payer: BC Managed Care – PPO

## 2012-11-15 ENCOUNTER — Encounter (HOSPITAL_COMMUNITY)
Admission: RE | Disposition: A | Discharge status: Home Health | Payer: Self-pay | Source: Ambulatory Visit | Attending: Orthopedic Surgery

## 2012-11-15 DIAGNOSIS — Z22322 Carrier or suspected carrier of Methicillin resistant Staphylococcus aureus: Secondary | ICD-10-CM

## 2012-11-15 DIAGNOSIS — F411 Generalized anxiety disorder: Secondary | ICD-10-CM | POA: Diagnosis present

## 2012-11-15 DIAGNOSIS — E871 Hypo-osmolality and hyponatremia: Secondary | ICD-10-CM | POA: Diagnosis not present

## 2012-11-15 DIAGNOSIS — F3289 Other specified depressive episodes: Secondary | ICD-10-CM | POA: Diagnosis present

## 2012-11-15 DIAGNOSIS — M1610 Unilateral primary osteoarthritis, unspecified hip: Principal | ICD-10-CM | POA: Diagnosis present

## 2012-11-15 DIAGNOSIS — M5126 Other intervertebral disc displacement, lumbar region: Secondary | ICD-10-CM | POA: Diagnosis present

## 2012-11-15 DIAGNOSIS — E7800 Pure hypercholesterolemia, unspecified: Secondary | ICD-10-CM | POA: Diagnosis present

## 2012-11-15 DIAGNOSIS — D500 Iron deficiency anemia secondary to blood loss (chronic): Secondary | ICD-10-CM | POA: Diagnosis not present

## 2012-11-15 DIAGNOSIS — M169 Osteoarthritis of hip, unspecified: Principal | ICD-10-CM | POA: Diagnosis present

## 2012-11-15 DIAGNOSIS — I10 Essential (primary) hypertension: Secondary | ICD-10-CM | POA: Diagnosis present

## 2012-11-15 DIAGNOSIS — F329 Major depressive disorder, single episode, unspecified: Secondary | ICD-10-CM | POA: Diagnosis present

## 2012-11-15 DIAGNOSIS — E119 Type 2 diabetes mellitus without complications: Secondary | ICD-10-CM | POA: Diagnosis present

## 2012-11-15 DIAGNOSIS — Z880 Allergy status to penicillin: Secondary | ICD-10-CM

## 2012-11-15 DIAGNOSIS — Z96649 Presence of unspecified artificial hip joint: Secondary | ICD-10-CM

## 2012-11-15 DIAGNOSIS — K219 Gastro-esophageal reflux disease without esophagitis: Secondary | ICD-10-CM | POA: Diagnosis present

## 2012-11-15 DIAGNOSIS — Z6834 Body mass index (BMI) 34.0-34.9, adult: Secondary | ICD-10-CM

## 2012-11-15 DIAGNOSIS — Z79899 Other long term (current) drug therapy: Secondary | ICD-10-CM

## 2012-11-15 DIAGNOSIS — M47817 Spondylosis without myelopathy or radiculopathy, lumbosacral region: Secondary | ICD-10-CM | POA: Diagnosis present

## 2012-11-15 DIAGNOSIS — D62 Acute posthemorrhagic anemia: Secondary | ICD-10-CM | POA: Diagnosis not present

## 2012-11-15 DIAGNOSIS — E669 Obesity, unspecified: Secondary | ICD-10-CM | POA: Diagnosis present

## 2012-11-15 HISTORY — PX: TOTAL HIP ARTHROPLASTY: SHX124

## 2012-11-15 LAB — GLUCOSE, CAPILLARY
Glucose-Capillary: 122 mg/dL — ABNORMAL HIGH (ref 70–99)
Glucose-Capillary: 161 mg/dL — ABNORMAL HIGH (ref 70–99)
Glucose-Capillary: 222 mg/dL — ABNORMAL HIGH (ref 70–99)
Glucose-Capillary: 162 mg/dL — ABNORMAL HIGH (ref 70–99)

## 2012-11-15 LAB — ABO/RH: ABO/RH(D): B NEG

## 2012-11-15 SURGERY — ARTHROPLASTY, HIP, TOTAL, ANTERIOR APPROACH
Anesthesia: General | Site: Hip | Laterality: Right | Wound class: Clean

## 2012-11-15 MED ORDER — ALUM & MAG HYDROXIDE-SIMETH 200-200-20 MG/5ML PO SUSP: 30.0000 mL | ORAL | Status: DC | PRN

## 2012-11-15 MED ORDER — STERILE WATER FOR IRRIGATION IR SOLN
Status: DC | PRN
  Administered 2012-11-15: 3000 mL

## 2012-11-15 MED ORDER — SCOPOLAMINE 1 MG/3DAYS TD PT72
MEDICATED_PATCH | TRANSDERMAL | Status: AC
  Filled 2012-11-15: qty 1

## 2012-11-15 MED ORDER — HYDROMORPHONE HCL PF 1 MG/ML IJ SOLN
0.2500 mg | INTRAMUSCULAR | Status: DC | PRN
  Administered 2012-11-15: 0.5 mg via INTRAVENOUS

## 2012-11-15 MED ORDER — FENTANYL CITRATE 0.05 MG/ML IJ SOLN
INTRAMUSCULAR | Status: DC | PRN
  Administered 2012-11-15: 50 ug via INTRAVENOUS
  Administered 2012-11-15: 100 ug via INTRAVENOUS
  Administered 2012-11-15: 50 ug via INTRAVENOUS

## 2012-11-15 MED ORDER — DIPHENHYDRAMINE HCL 12.5 MG/5ML PO ELIX: 25.0000 mg | ORAL_SOLUTION | Freq: Four times a day (QID) | ORAL | Status: DC | PRN

## 2012-11-15 MED ORDER — NEOSTIGMINE METHYLSULFATE 1 MG/ML IJ SOLN
INTRAMUSCULAR | Status: DC | PRN
  Administered 2012-11-15: 5 mg via INTRAVENOUS

## 2012-11-15 MED ORDER — CLINDAMYCIN PHOSPHATE 900 MG/50ML IV SOLN
900.0000 mg | INTRAVENOUS | Status: AC
  Administered 2012-11-15: 900 mg via INTRAVENOUS

## 2012-11-15 MED ORDER — GLYBURIDE-METFORMIN 5-500 MG PO TABS: 1.0000 | ORAL_TABLET | Freq: Two times a day (BID) | ORAL | Status: DC

## 2012-11-15 MED ORDER — MENTHOL 3 MG MT LOZG: 1.0000 | LOZENGE | OROMUCOSAL | Status: DC | PRN

## 2012-11-15 MED ORDER — SODIUM CHLORIDE 0.9 % IV SOLN
INTRAVENOUS | Status: DC
  Administered 2012-11-15 – 2012-11-16 (×3): via INTRAVENOUS
  Filled 2012-11-15 (×7): qty 1000

## 2012-11-15 MED ORDER — HYDROMORPHONE HCL PF 1 MG/ML IJ SOLN
INTRAMUSCULAR | Status: AC
  Filled 2012-11-15: qty 1

## 2012-11-15 MED ORDER — HYDROMORPHONE HCL PF 1 MG/ML IJ SOLN: 0.2000 mg | INTRAMUSCULAR | Status: DC | PRN

## 2012-11-15 MED ORDER — ONDANSETRON HCL 4 MG/2ML IJ SOLN
INTRAMUSCULAR | Status: DC | PRN
  Administered 2012-11-15: 4 mg via INTRAVENOUS

## 2012-11-15 MED ORDER — ONDANSETRON HCL 4 MG PO TABS: 4.0000 mg | ORAL_TABLET | Freq: Four times a day (QID) | ORAL | Status: DC | PRN

## 2012-11-15 MED ORDER — TRANEXAMIC ACID 100 MG/ML IV SOLN
1000.0000 mg | Freq: Once | INTRAVENOUS | Status: AC
  Administered 2012-11-15: 1000 mg via INTRAVENOUS
  Filled 2012-11-15: qty 10

## 2012-11-15 MED ORDER — ZOLPIDEM TARTRATE 5 MG PO TABS: 5.0000 mg | ORAL_TABLET | Freq: Every evening | ORAL | Status: DC | PRN

## 2012-11-15 MED ORDER — GABAPENTIN 100 MG PO CAPS
200.0000 mg | ORAL_CAPSULE | Freq: Three times a day (TID) | ORAL | Status: DC
  Administered 2012-11-15 – 2012-11-16 (×3): 200 mg via ORAL
  Filled 2012-11-15 (×5): qty 2

## 2012-11-15 MED ORDER — ROCURONIUM BROMIDE 100 MG/10ML IV SOLN
INTRAVENOUS | Status: DC | PRN
  Administered 2012-11-15: 40 mg via INTRAVENOUS

## 2012-11-15 MED ORDER — SUCCINYLCHOLINE CHLORIDE 20 MG/ML IJ SOLN
INTRAMUSCULAR | Status: DC | PRN
  Administered 2012-11-15: 100 mg via INTRAVENOUS

## 2012-11-15 MED ORDER — DOCUSATE SODIUM 100 MG PO CAPS
100.0000 mg | ORAL_CAPSULE | Freq: Two times a day (BID) | ORAL | Status: DC
  Administered 2012-11-15 – 2012-11-16 (×3): 100 mg via ORAL

## 2012-11-15 MED ORDER — PANTOPRAZOLE SODIUM 40 MG PO TBEC
80.0000 mg | DELAYED_RELEASE_TABLET | Freq: Every day | ORAL | Status: DC
  Administered 2012-11-15 – 2012-11-16 (×2): 80 mg via ORAL
  Filled 2012-11-15 (×2): qty 2

## 2012-11-15 MED ORDER — INSULIN ASPART 100 UNIT/ML ~~LOC~~ SOLN: 0.0000 [IU] | Freq: Every day | SUBCUTANEOUS | Status: DC

## 2012-11-15 MED ORDER — HYDROCHLOROTHIAZIDE 12.5 MG PO CAPS
12.5000 mg | ORAL_CAPSULE | Freq: Every day | ORAL | Status: DC
  Administered 2012-11-15 – 2012-11-16 (×2): 12.5 mg via ORAL
  Filled 2012-11-15 (×2): qty 1

## 2012-11-15 MED ORDER — FERROUS SULFATE 325 (65 FE) MG PO TABS
325.0000 mg | ORAL_TABLET | Freq: Three times a day (TID) | ORAL | Status: DC
  Administered 2012-11-15 – 2012-11-16 (×3): 325 mg via ORAL
  Filled 2012-11-15 (×6): qty 1

## 2012-11-15 MED ORDER — LIDOCAINE HCL (CARDIAC) 20 MG/ML IV SOLN
INTRAVENOUS | Status: DC | PRN
  Administered 2012-11-15: 100 mg via INTRAVENOUS

## 2012-11-15 MED ORDER — HYDROCODONE-ACETAMINOPHEN 7.5-325 MG PO TABS
1.0000 | ORAL_TABLET | ORAL | Status: DC
  Administered 2012-11-15 – 2012-11-16 (×7): 1 via ORAL
  Filled 2012-11-15 (×7): qty 1

## 2012-11-15 MED ORDER — LACTATED RINGERS IV SOLN
INTRAVENOUS | Status: DC | PRN
  Administered 2012-11-15 (×3): via INTRAVENOUS

## 2012-11-15 MED ORDER — INSULIN ASPART 100 UNIT/ML ~~LOC~~ SOLN
0.0000 [IU] | Freq: Three times a day (TID) | SUBCUTANEOUS | Status: DC
  Administered 2012-11-16: 2 [IU] via SUBCUTANEOUS

## 2012-11-15 MED ORDER — ONDANSETRON HCL 4 MG/2ML IJ SOLN: 4.0000 mg | Freq: Four times a day (QID) | INTRAMUSCULAR | Status: DC | PRN

## 2012-11-15 MED ORDER — DEXAMETHASONE SODIUM PHOSPHATE 10 MG/ML IJ SOLN
INTRAMUSCULAR | Status: DC | PRN
  Administered 2012-11-15: 10 mg via INTRAVENOUS

## 2012-11-15 MED ORDER — CYCLOBENZAPRINE HCL 10 MG PO TABS
10.0000 mg | ORAL_TABLET | Freq: Two times a day (BID) | ORAL | Status: DC | PRN
  Administered 2012-11-15: 10 mg via ORAL
  Filled 2012-11-15: qty 1

## 2012-11-15 MED ORDER — SENNA 8.6 MG PO TABS
1.0000 | ORAL_TABLET | Freq: Two times a day (BID) | ORAL | Status: DC
  Administered 2012-11-15 – 2012-11-16 (×3): 8.6 mg via ORAL
  Filled 2012-11-15 (×3): qty 1

## 2012-11-15 MED ORDER — CLINDAMYCIN PHOSPHATE 600 MG/50ML IV SOLN
600.0000 mg | Freq: Four times a day (QID) | INTRAVENOUS | Status: AC
  Administered 2012-11-15 (×2): 600 mg via INTRAVENOUS
  Filled 2012-11-15 (×2): qty 50

## 2012-11-15 MED ORDER — GLYBURIDE 5 MG PO TABS
5.0000 mg | ORAL_TABLET | Freq: Two times a day (BID) | ORAL | Status: DC
  Administered 2012-11-15 – 2012-11-16 (×2): 5 mg via ORAL
  Filled 2012-11-15 (×4): qty 1

## 2012-11-15 MED ORDER — MIDAZOLAM HCL 5 MG/5ML IJ SOLN
INTRAMUSCULAR | Status: DC | PRN
  Administered 2012-11-15: 2 mg via INTRAVENOUS

## 2012-11-15 MED ORDER — METFORMIN HCL 500 MG PO TABS
500.0000 mg | ORAL_TABLET | Freq: Two times a day (BID) | ORAL | Status: DC
  Administered 2012-11-15 – 2012-11-16 (×2): 500 mg via ORAL
  Filled 2012-11-15 (×4): qty 1

## 2012-11-15 MED ORDER — DULOXETINE HCL 60 MG PO CPEP
60.0000 mg | ORAL_CAPSULE | Freq: Every day | ORAL | Status: DC
  Administered 2012-11-15: 60 mg via ORAL
  Filled 2012-11-15 (×2): qty 1

## 2012-11-15 MED ORDER — LACTATED RINGERS IV SOLN: INTRAVENOUS | Status: DC

## 2012-11-15 MED ORDER — LIRAGLUTIDE 18 MG/3ML ~~LOC~~ SOLN
1.8000 mg | Freq: Every day | SUBCUTANEOUS | Status: DC
  Administered 2012-11-16: 1.8 mg via SUBCUTANEOUS

## 2012-11-15 MED ORDER — CLINDAMYCIN PHOSPHATE 900 MG/50ML IV SOLN
INTRAVENOUS | Status: AC
  Filled 2012-11-15: qty 50

## 2012-11-15 MED ORDER — ACETAMINOPHEN 10 MG/ML IV SOLN
INTRAVENOUS | Status: DC | PRN
  Administered 2012-11-15: 1000 mg via INTRAVENOUS

## 2012-11-15 MED ORDER — PROPOFOL 10 MG/ML IV BOLUS
INTRAVENOUS | Status: DC | PRN
  Administered 2012-11-15: 170 mg via INTRAVENOUS

## 2012-11-15 MED ORDER — VANCOMYCIN HCL IN DEXTROSE 1-5 GM/200ML-% IV SOLN
INTRAVENOUS | Status: AC
  Filled 2012-11-15: qty 200

## 2012-11-15 MED ORDER — SODIUM CHLORIDE 0.9 % IV SOLN
1000.0000 mg | INTRAVENOUS | Status: DC | PRN
  Administered 2012-11-15: 1000 mg via INTRAVENOUS

## 2012-11-15 MED ORDER — 0.9 % SODIUM CHLORIDE (POUR BTL) OPTIME
TOPICAL | Status: DC | PRN
  Administered 2012-11-15: 1000 mL

## 2012-11-15 MED ORDER — ACETAMINOPHEN 10 MG/ML IV SOLN
INTRAVENOUS | Status: AC
  Filled 2012-11-15: qty 100

## 2012-11-15 MED ORDER — METHYLPHENIDATE HCL ER (OSM) 36 MG PO TBCR: 36.0000 mg | EXTENDED_RELEASE_TABLET | Freq: Every day | ORAL | Status: DC

## 2012-11-15 MED ORDER — PHENOL 1.4 % MT LIQD: 1.0000 | OROMUCOSAL | Status: DC | PRN

## 2012-11-15 MED ORDER — POLYETHYLENE GLYCOL 3350 17 G PO PACK: 17.0000 g | PACK | Freq: Every day | ORAL | Status: DC | PRN

## 2012-11-15 MED ORDER — HYDROCHLOROTHIAZIDE 25 MG PO TABS
12.5000 mg | ORAL_TABLET | Freq: Every morning | ORAL | Status: DC
Start: 2012-11-15 — End: 2012-11-15

## 2012-11-15 MED ORDER — OLMESARTAN MEDOXOMIL 20 MG PO TABS
20.0000 mg | ORAL_TABLET | Freq: Every day | ORAL | Status: DC
  Administered 2012-11-15 – 2012-11-16 (×2): 20 mg via ORAL
  Filled 2012-11-15 (×2): qty 1

## 2012-11-15 MED ORDER — GLYCOPYRROLATE 0.2 MG/ML IJ SOLN
INTRAMUSCULAR | Status: DC | PRN
  Administered 2012-11-15: 0.6 mg via INTRAVENOUS

## 2012-11-15 MED ORDER — RIVAROXABAN 10 MG PO TABS
10.0000 mg | ORAL_TABLET | Freq: Every day | ORAL | Status: DC
  Administered 2012-11-16: 10 mg via ORAL
  Filled 2012-11-15 (×2): qty 1

## 2012-11-15 SURGICAL SUPPLY — 40 items
BAG ZIPLOCK 12X15 (MISCELLANEOUS) ×2 IMPLANT
BLADE SAW SGTL 18X1.27X75 (BLADE) ×2 IMPLANT
CELLS DAT CNTRL 66122 CELL SVR (MISCELLANEOUS) ×1 IMPLANT
CLOTH BEACON ORANGE TIMEOUT ST (SAFETY) ×2 IMPLANT
DERMABOND ADVANCED (GAUZE/BANDAGES/DRESSINGS) ×1
DERMABOND ADVANCED .7 DNX12 (GAUZE/BANDAGES/DRESSINGS) ×1 IMPLANT
DRAPE C-ARM 42X72 X-RAY (DRAPES) ×2 IMPLANT
DRAPE STERI IOBAN 125X83 (DRAPES) ×2 IMPLANT
DRAPE U-SHAPE 47X51 STRL (DRAPES) ×6 IMPLANT
DRSG AQUACEL AG ADV 3.5X10 (GAUZE/BANDAGES/DRESSINGS) ×2 IMPLANT
DRSG TEGADERM 4X4.75 (GAUZE/BANDAGES/DRESSINGS) ×2 IMPLANT
DURAPREP 26ML APPLICATOR (WOUND CARE) ×2 IMPLANT
ELECT BLADE TIP CTD 4 INCH (ELECTRODE) ×2 IMPLANT
ELECT REM PT RETURN 9FT ADLT (ELECTROSURGICAL) ×2
ELECTRODE REM PT RTRN 9FT ADLT (ELECTROSURGICAL) ×1 IMPLANT
EVACUATOR 1/8 PVC DRAIN (DRAIN) ×2 IMPLANT
FACESHIELD LNG OPTICON STERILE (SAFETY) ×8 IMPLANT
GAUZE SPONGE 2X2 8PLY STRL LF (GAUZE/BANDAGES/DRESSINGS) ×1 IMPLANT
GLOVE BIOGEL PI IND STRL 7.5 (GLOVE) ×1 IMPLANT
GLOVE BIOGEL PI IND STRL 8 (GLOVE) ×1 IMPLANT
GLOVE BIOGEL PI INDICATOR 7.5 (GLOVE) ×1
GLOVE BIOGEL PI INDICATOR 8 (GLOVE) ×1
GLOVE ECLIPSE 8.0 STRL XLNG CF (GLOVE) ×2 IMPLANT
GLOVE ORTHO TXT STRL SZ7.5 (GLOVE) ×4 IMPLANT
GOWN BRE IMP PREV XXLGXLNG (GOWN DISPOSABLE) ×4 IMPLANT
GOWN STRL NON-REIN LRG LVL3 (GOWN DISPOSABLE) ×2 IMPLANT
KIT BASIN OR (CUSTOM PROCEDURE TRAY) ×2 IMPLANT
PACK TOTAL JOINT (CUSTOM PROCEDURE TRAY) ×2 IMPLANT
PADDING CAST COTTON 6X4 STRL (CAST SUPPLIES) ×2 IMPLANT
RTRCTR WOUND ALEXIS 18CM MED (MISCELLANEOUS) ×2
SPONGE GAUZE 2X2 STER 10/PKG (GAUZE/BANDAGES/DRESSINGS) ×1
SUCTION FRAZIER 12FR DISP (SUCTIONS) ×2 IMPLANT
SUT MNCRL AB 4-0 PS2 18 (SUTURE) ×2 IMPLANT
SUT VIC AB 1 CT1 36 (SUTURE) ×6 IMPLANT
SUT VIC AB 2-0 CT1 27 (SUTURE) ×2
SUT VIC AB 2-0 CT1 TAPERPNT 27 (SUTURE) ×2 IMPLANT
SUT VLOC 180 0 24IN GS25 (SUTURE) ×2 IMPLANT
TOWEL OR 17X26 10 PK STRL BLUE (TOWEL DISPOSABLE) ×4 IMPLANT
TRAY FOLEY CATH 14FRSI W/METER (CATHETERS) ×2 IMPLANT
WATER STERILE IRR 1500ML POUR (IV SOLUTION) ×4 IMPLANT

## 2012-11-15 NOTE — Anesthesia Postprocedure Evaluation (Signed)
  Anesthesia Post-op Note  Patient: [name]  Procedure(s) Performed: Procedure(s) (LRB): TOTAL HIP ARTHROPLASTY ANTERIOR APPROACH (Right)  Patient Location: PACU  Anesthesia Type: General  Level of Consciousness: awake and alert   Airway and Oxygen Therapy: Patient Spontanous Breathing  Post-op Pain: mild  Post-op Assessment: Post-op Vital signs reviewed, Patient's Cardiovascular Status Stable, Respiratory Function Stable, Patent Airway and No signs of Nausea or vomiting  Last Vitals:  Filed Vitals:   11/15/12 0900  BP: 121/67  Pulse: 92  Temp:   Resp:     Post-op Vital Signs: stable   Complications: No apparent anesthesia complications

## 2012-11-15 NOTE — Op Note (Signed)
NAME:  [name]                ACCOUNT NO.: [id number]      MEDICAL RECORD NO.: [id number]      FACILITY:  [facility]      PHYSICIAN:  Durene Romans D  DATE OF BIRTH:  11-23-1950     DATE OF PROCEDURE:  11/15/2012                                 OPERATIVE REPORT         PREOPERATIVE DIAGNOSIS: Right  hip osteoarthritis.      POSTOPERATIVE DIAGNOSIS:  Right hip osteoarthritis.      PROCEDURE:  Right total hip replacement through an anterior approach   utilizing DePuy THR system, component size 50 pinnacle cup, a size 32+4 neutral   Altrex liner, a size 4 Hi Tri Lock stem with a 32+1 delta ceramic   ball.      SURGEON:  Madlyn Frankel. Charlann Boxer, M.D.      ASSISTANT:  Leilani Able, PA-C      ANESTHESIA:  General.      SPECIMENS:  None.      COMPLICATIONS:  None.      BLOOD LOSS:  400 cc     DRAINS:  One Hemovac.      INDICATION OF THE PROCEDURE:  [name] is a 62 y.o. female who had   presented to office for evaluation of right hip pain.  Radiographs revealed   progressive degenerative changes with bone-on-bone   articulation to the  hip joint.  The patient had painful limited range of   motion significantly affecting their overall quality of life.  The patient was failing to    respond to conservative measures, and at this point was ready   to proceed with more definitive measures.  The patient has noted progressive   degenerative changes in his hip, progressive problems and dysfunction   with regarding the hip prior to surgery.  Consent was obtained for   benefit of pain relief.  Specific risk of infection, DVT, component   failure, dislocation, need for revision surgery, as well discussion of   the anterior versus posterior approach were reviewed.  Consent was   obtained for benefit of anterior pain relief through an anterior   approach.      PROCEDURE IN DETAIL:  The patient was brought to operative theater.   Once adequate  anesthesia, preoperative antibiotics, 900mg  of  Clindamycin and 1gm of vancomycin due to positive MRSA screen administered.   The patient was positioned supine on the OSI Hanna table.  Once adequate   padding of boney process was carried out, we had predraped out the hip, and  used fluoroscopy to confirm orientation of the pelvis and position.      The right hip was then prepped and draped from proximal iliac crest to   mid thigh with shower curtain technique.      Time-out was performed identifying the patient, planned procedure, and   extremity.     An incision was then made 2 cm distal and lateral to the   anterior superior iliac spine extending over the orientation of the   tensor fascia lata muscle and sharp dissection was carried down to the   fascia of the muscle and protractor placed in the soft tissues.      The  fascia was then incised.  The muscle belly was identified and swept   laterally and retractor placed along the superior neck.  Following   cauterization of the circumflex vessels and removing some pericapsular   fat, a second cobra retractor was placed on the inferior neck.  A third   retractor was placed on the anterior acetabulum after elevating the   anterior rectus.  A L-capsulotomy was along the line of the   superior neck to the trochanteric fossa, then extended proximally and   distally.  Tag sutures were placed and the retractors were then placed   intracapsular.  We then identified the trochanteric fossa and   orientation of my neck cut, confirmed this radiographically   and then made a neck osteotomy with the femur on traction.  The femoral   head was removed without difficulty or complication.  She was noted have significant synovitis and adherent capsule.  Traction was let   off and retractors were placed posterior and anterior around the   acetabulum.      The labrum and foveal tissue were debrided.  I began reaming with a 45mm   reamer and reamed up to  45mm reamer with good bony bed preparation and a 49mm   cup was chosen.  The final 50mm Pinnacle cup was then impacted under fluoroscopy  to confirm the depth of penetration and orientation with respect to   abduction.  A screw was placed followed by the hole eliminator.  The final   32+4 neutral Altrex liner was impacted with good visualized rim fit.  The cup was positioned anatomically within the acetabular portion of the pelvis.      At this point, the femur was rolled at 80 degrees.  Further capsule was   released off the inferior aspect of the femoral neck.  I then   released the superior capsule proximally.  The hook was placed laterally   along the femur and elevated manually and held in position with the bed   hook.  The leg was then extended and adducted with the leg rolled to 100   degrees of external rotation.  Once the proximal femur was fully   exposed, I used a box osteotome to set orientation.  I then began   broaching with the starting chili pepper broach and passed this by hand and then broached up to 4.  With the 4 broach in place I chose a high offset neck and did a trial reduction with 32+1 ball.  The offset was appropriate, leg lengths   appeared to be equal, confirmed radiographically.   Given these findings, I went ahead and dislocated the hip, repositioned all   retractors and positioned the right hip in the extended and abducted position.  The final 4 Hi Tri Lock stem was   chosen and it was impacted down to the level of neck cut.  Based on this   and the trial reduction, a 32+1 delta ceramic ball was chosen and   impacted onto a clean and dry trunnion, and the hip was reduced.  The   hip had been irrigated throughout the case again at this point.  I did   reapproximate the superior capsular leaflet to the anterior leaflet   using #1 Vicryl, placed a medium Hemovac drain deep.  The fascia of the   tensor fascia lata muscle was then reapproximated using #1 Vicryl.  The    remaining wound was closed with 2-0 Vicryl and running  4-0 Monocryl.   The hip was cleaned, dried, and dressed sterilely using Dermabond and   Aquacel dressing.  Drain site dressed separately.  She was then brought   to recovery room in stable condition tolerating the procedure well.    Leilani Able, PA-C was present for the entirety of the case involved from   preoperative positioning, perioperative retractor management, general   facilitation of the case, as well as primary wound closure as assistant.            Madlyn Frankel Charlann Boxer, M.D.            MDO/MEDQ  D:  08/04/2011  T:  08/04/2011  Job:  [id number]      Electronically Signed by Durene Romans M.D. on 08/10/2011 09:15:38 AM

## 2012-11-15 NOTE — Interval H&P Note (Signed)
History and Physical Interval Note:  11/15/2012 6:42 AM  [name]  has presented today for surgery, with the diagnosis of Osteoarthritis of the Right Hip  The various methods of treatment have been discussed with the patient and family. After consideration of risks, benefits and other options for treatment, the patient has consented to  Procedure(s) (LRB) with comments: TOTAL HIP ARTHROPLASTY ANTERIOR APPROACH (Right) as a surgical intervention .  The patient's history has been reviewed, patient examined, no change in status, stable for surgery.  I have reviewed the patient's chart and labs.  Questions were answered to the patient's satisfaction.     [name]

## 2012-11-15 NOTE — Transfer of Care (Signed)
Immediate Anesthesia Transfer of Care Note  Patient: [name]  Procedure(s) Performed: Procedure(s) (LRB) with comments: TOTAL HIP ARTHROPLASTY ANTERIOR APPROACH (Right)  Patient Location: PACU  Anesthesia Type:General  Level of Consciousness: sedated  Airway & Oxygen Therapy: Patient Spontanous Breathing and Patient connected to face mask oxygen  Post-op Assessment: Report given to PACU RN and Post -op Vital signs reviewed and stable  Post vital signs: Reviewed and stable  Complications: No apparent anesthesia complications

## 2012-11-15 NOTE — Progress Notes (Signed)
Utilization review completed.  

## 2012-11-15 NOTE — Evaluation (Signed)
Physical Therapy Evaluation Patient Details Name: [name] MRN: [id number] DOB: 21-Aug-1951 Today's Date: 11/15/2012 Time: 4098-1191 PT Time Calculation (min): 29 min  PT Assessment / Plan / Recommendation Clinical Impression  Pt presents s/p R THA (direct ant) POD 0 with decreased strength, ROM and functional mobility.  Tolerated OOB and ambulation into hallway very well at min assist with no c/o dizziness.  Pt will benefit from skilled PT in acute venue to address deficits.  PT recommends HHPT for follow up at D/C to maximize pts safety and independence.     PT Assessment  Patient needs continued PT services    Follow Up Recommendations  Home health PT    Does the patient have the potential to tolerate intense rehabilitation      Barriers to Discharge None      Equipment Recommendations  Rolling walker with 5" wheels    Recommendations for Other Services OT consult   Frequency 7X/week    Precautions / Restrictions     Pertinent Vitals/Pain 4/10 pain following amb, ice pack applied      Mobility  Bed Mobility Bed Mobility: Supine to Sit Supine to Sit: 4: Min assist Details for Bed Mobility Assistance: Assist for RLE out of bed with cues for hand placement and technique to self assist.  Transfers Transfers: Sit to Stand;Stand to Sit Sit to Stand: 4: Min assist;From elevated surface;With upper extremity assist;From bed Stand to Sit: 4: Min guard;With upper extremity assist;With armrests;To chair/3-in-1 Details for Transfer Assistance: Assist to rise and steady with cues for hand placement and LE management.  Ambulation/Gait Ambulation/Gait Assistance: 4: Min guard;4: Min Environmental consultant (Feet): 80 Feet Assistive device: Rolling walker Ambulation/Gait Assistance Details: cues for sequencing/technique with RW and to maintain upright posture.  Pt able to transition from step to gait patter to step through during session.  Gait Pattern: Step-to  pattern Gait velocity: decreased Stairs: No Wheelchair Mobility Wheelchair Mobility: No    Exercises     PT Diagnosis: Difficulty walking;Generalized weakness;Acute pain  PT Problem List: Decreased strength;Decreased range of motion;Decreased balance;Decreased mobility;Decreased activity tolerance;Decreased knowledge of use of DME;Decreased knowledge of precautions;Pain PT Treatment Interventions: DME instruction;Gait training;Stair training;Functional mobility training;Therapeutic activities;Therapeutic exercise;Balance training;Patient/family education   PT Goals Acute Rehab PT Goals PT Goal Formulation: With patient Time For Goal Achievement: 11/18/12 Potential to Achieve Goals: Good Pt will go Supine/Side to Sit: with supervision PT Goal: Supine/Side to Sit - Progress: Goal set today Pt will go Sit to Supine/Side: with supervision PT Goal: Sit to Supine/Side - Progress: Goal set today Pt will go Sit to Stand: with supervision PT Goal: Sit to Stand - Progress: Goal set today Pt will Ambulate: >150 feet;with supervision;with least restrictive assistive device PT Goal: Ambulate - Progress: Goal set today Pt will Go Up / Down Stairs: 1-2 stairs;with supervision;with least restrictive assistive device PT Goal: Up/Down Stairs - Progress: Goal set today Pt will Perform Home Exercise Program: with supervision, verbal cues required/provided PT Goal: Perform Home Exercise Program - Progress: Goal set today  Visit Information  Last PT Received On: 11/15/12 Assistance Needed: +1    Subjective Data  Subjective: I will try to get up. Patient Stated Goal: to return home.    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  RLE Sensation: WFL - Light Touch Left Lower Extremity Assessment LLE ROM/Strength/Tone: WFL for tasks assessed LLE Sensation: WFL - Light Touch Trunk Assessment Trunk Assessment: Normal   Balance    End of Session PT - End of Session Activity Tolerance: Patient tolerated treatment well Patient left: in chair;with call bell/phone within reach;with family/visitor present Nurse Communication: Mobility status  GP     Vista Deck 11/15/2012, 5:16 PM

## 2012-11-16 ENCOUNTER — Encounter (HOSPITAL_COMMUNITY): Payer: Self-pay | Admitting: Orthopedic Surgery

## 2012-11-16 DIAGNOSIS — D500 Iron deficiency anemia secondary to blood loss (chronic): Secondary | ICD-10-CM | POA: Diagnosis not present

## 2012-11-16 DIAGNOSIS — E669 Obesity, unspecified: Secondary | ICD-10-CM | POA: Diagnosis present

## 2012-11-16 DIAGNOSIS — E871 Hypo-osmolality and hyponatremia: Secondary | ICD-10-CM | POA: Diagnosis not present

## 2012-11-16 LAB — BASIC METABOLIC PANEL
Chloride: 95 mEq/L — ABNORMAL LOW (ref 96–112)
GFR calc Af Amer: >90 mL/min (ref >90)
GFR calc non Af Amer: >90 mL/min (ref >90)
Potassium: 3.7 mEq/L (ref 3.5–5.1)
Sodium: 131 mEq/L — ABNORMAL LOW (ref 135–145)

## 2012-11-16 LAB — GLUCOSE, CAPILLARY: Glucose-Capillary: 91 mg/dL (ref 70–99)

## 2012-11-16 LAB — CBC
Platelets: 259 10*3/uL (ref 150–400)
RDW: 12.8 % (ref 11.5–15.5)
WBC: 9 10*3/uL (ref 4.0–10.5)

## 2012-11-16 MED ORDER — DSS 100 MG PO CAPS: 100.0000 mg | ORAL_CAPSULE | Freq: Two times a day (BID) | ORAL | Status: DC

## 2012-11-16 MED ORDER — HYDROCODONE-ACETAMINOPHEN 7.5-325 MG PO TABS: 1.0000 | ORAL_TABLET | ORAL | Status: DC | PRN

## 2012-11-16 MED ORDER — FERROUS SULFATE 325 (65 FE) MG PO TABS: 325.0000 mg | ORAL_TABLET | Freq: Three times a day (TID) | ORAL | Status: DC

## 2012-11-16 MED ORDER — CYCLOBENZAPRINE HCL 10 MG PO TABS: 10.0000 mg | ORAL_TABLET | Freq: Two times a day (BID) | ORAL | Status: DC | PRN

## 2012-11-16 MED ORDER — ASPIRIN EC 325 MG PO TBEC: 325.0000 mg | DELAYED_RELEASE_TABLET | Freq: Two times a day (BID) | ORAL | Status: DC

## 2012-11-16 MED ORDER — POLYETHYLENE GLYCOL 3350 17 G PO PACK: 17.0000 g | PACK | Freq: Every day | ORAL | Status: DC | PRN

## 2012-11-16 NOTE — Evaluation (Signed)
Occupational Therapy Evaluation Patient Details Name: [name] MRN: [id number] DOB: Jul 21, 1951 Today's Date: 11/16/2012 Time: 1191-4782 OT Time Calculation (min): 45 min  OT Assessment / Plan / Recommendation Clinical Impression  this 62 year old female was admitted for R anterior direct THA.  All education was completed.      OT Assessment  Patient does not need any further OT services    Follow Up Recommendations  No OT follow up    Barriers to Discharge      Equipment Recommendations  3 in 1 bedside comode    Recommendations for Other Services    Frequency       Precautions / Restrictions Precautions Precautions: None Restrictions Weight Bearing Restrictions: No   Pertinent Vitals/Pain No pain, sore R hip    ADL  Grooming: Performed;Set up Where Assessed - Grooming: Supported sitting Upper Body Bathing: Performed;Set up Where Assessed - Upper Body Bathing: Unsupported sitting Lower Body Bathing: Performed;Minimal assistance Where Assessed - Lower Body Bathing: Supported sit to stand Upper Body Dressing: Performed;Minimal assistance (iv) Where Assessed - Upper Body Dressing: Unsupported sitting Lower Body Dressing: Performed;Minimal assistance Where Assessed - Lower Body Dressing: Supported sit to stand Toilet Transfer: Research scientist (life sciences) Method: Sit to Barista: Raised toilet seat with arms (or 3-in-1 over toilet) Toileting - Clothing Manipulation and Hygiene: Simulated;Supervision/safety Where Assessed - Engineer, mining and Hygiene: Sit to stand from 3-in-1 or toilet Tub/Shower Transfer:  (reviewed tub transfer bench ) Equipment Used: Rolling walker Transfers/Ambulation Related to ADLs: ambulated to bathroom with supervision ADL Comments: will need a little help with R sock/shoe.  Husband can help.  Practiced bed mobility 2 ways:  pt did better rolling onto operated hip and pushing up to  side.  discussed sheet to help leg over on bed    OT Diagnosis:    OT Problem List:   OT Treatment Interventions:     OT Goals    Visit Information  Last OT Received On: 11/16/12 Assistance Needed: +1    Subjective Data  Subjective: I can borrow that tub bench Patient Stated Goal: none stated   Prior Functioning     Home Living Lives With: Spouse Available Help at Discharge: Family;Available 24 hours/day Type of Home: House Home Access: Stairs to enter [employer] of Steps: 1 Entrance Stairs-Rails: None Home Layout: Able to live on main level with bedroom/bathroom Bathroom Shower/Tub: Engineer, manufacturing systems: Standard Prior Function Level of Independence: Independent Driving: Yes Communication Communication: No difficulties Dominant Hand: Right         Vision/Perception     Cognition  Cognition Overall Cognitive Status: Appears within functional limits for tasks assessed/performed Arousal/Alertness: Awake/alert Orientation Level: Appears intact for tasks assessed Behavior During Session: [facility] for tasks performed    Extremity/Trunk Assessment Right Upper Extremity Assessment RUE ROM/Strength/Tone: [facility] for tasks assessed Left Upper Extremity Assessment LUE ROM/Strength/Tone: WFL for tasks assessed     Mobility Bed Mobility Supine to Sit: 5: Supervision (modified technique) Transfers Sit to Stand: 5: Supervision Details for Transfer Assistance: cues for hand placement     Exercise     Balance     End of Session OT - End of Session Activity Tolerance: Patient tolerated treatment well Patient left: in chair;with call bell/phone within reach  GO     [name] 11/16/2012, 9:12 AM Marica Otter, OTR/L [phone] 11/16/2012

## 2012-11-16 NOTE — Progress Notes (Signed)
CSW consulted for SNF placement. CSW met with pt and reviewed PN / recommendations. Pt plans to return home following hospital d/c. RNCM will assist with d/c planning needs.  Dekisha Mesmer LCSW [phone] 

## 2012-11-16 NOTE — Care Management Note (Signed)
    Page 1 of 2   11/16/2012     3:47:08 PM   CARE MANAGEMENT NOTE 11/16/2012  Patient:  [name], [name]   Account Number:  [id number]  Date Initiated:  11/15/2012  Documentation initiated by:  Colleen Can  Subjective/Objective Assessment:   dx rt hip osteoarthritis; Total hip replacemnt-anterior approach     Action/Plan:   CM spoke with patient. Plans are for patient to return to her home in [place] [facility] [place] [facility] where spouse will be caregiver. She will need RW and 3n1   Anticipated DC Date:  11/18/2012   Anticipated DC Plan:  HOME W HOME HEALTH SERVICES      DC Planning Services  CM consult      PAC Choice  DURABLE MEDICAL EQUIPMENT  HOME HEALTH   Choice offered to / List presented to:  C-1 Patient   DME arranged  3-N-1  Levan Hurst      DME agency  [facility]     HH arranged  HH-2 PT      [facility] agency  [facility]   Status of service:  Completed, signed off Medicare Important Message given?  NO (If response is "NO", the following Medicare IM given date fields will be blank) Date Medicare IM given:   Date Additional Medicare IM given:    Discharge Disposition:  HOME W HOME HEALTH SERVICES  Per UR Regulation:  Reviewed for med. necessity/level of care/duration of stay  If discussed at Long Length of Stay Meetings, dates discussed:    Comments:  11/16/2012 Colleen Can BSN RN CCM [phone] [facility] will provide HHpt services with start date 11/17/2012. Pt given [facility] contact information. Faxed face sheet, op note, h&p, PT notes, HH orders to [phone]; confirmation received.

## 2012-11-16 NOTE — Progress Notes (Signed)
   Subjective: 1 Day Post-Op Procedure(s) (LRB): TOTAL HIP ARTHROPLASTY ANTERIOR APPROACH (Right)   Patient reports pain as mild, pain well controlled. No events throughout the night. Ready to be discharged home if she does well with PT and pain well controlled.   Objective:   VITALS:   Filed Vitals:   11/16/12  BP: 102/69  Pulse: 79  Temp: 97.8 F (36.6 C)   Resp: 15    Neurovascular intact Dorsiflexion/Plantar flexion intact Incision: dressing C/D/I No cellulitis present Compartment soft  LABS  Basename 11/16/12 0355  HGB 10.3*  HCT 29.4*  WBC 9.0  PLT 259     Basename 11/16/12 0355  NA 131*  K 3.7  BUN 6  CREATININE 0.50  GLUCOSE 117*     Assessment/Plan: 1 Day Post-Op Procedure(s) (LRB): TOTAL HIP ARTHROPLASTY ANTERIOR APPROACH (Right) HV drain d/c'ed Foley cath d/c'ed Advance diet Up with therapy D/C IV fluids Discharge home with home health Follow up in 2 weeks at [facility]. Follow up with OLIN,Jessiah Wojnar D in 2 weeks.  Contact information:  [facility] [address] [place] [address] [phone]    Expected ABLA  Treated with iron and will observe  Obese (BMI 30-39.9) Estimated Body mass index is 34.95 kg/(m^2) as calculated from the following:   Height as of this encounter: 5\' 5" (1.651 m).   Weight as of this encounter: 210 lb(95.255 kg). Patient also counseled that weight may inhibit the healing process Patient counseled that losing weight will help with future health issues  Hyponatremia Treated with IV fluids and will observe      Anastasio Auerbach. Arnett Galindez   PAC  11/16/2012, 9:10 AM

## 2012-11-16 NOTE — Progress Notes (Signed)
Physical Therapy Treatment Patient Details Name: [name] MRN: [id number] DOB: September 13, 1951 Today's Date: 11/16/2012 Time: 4132-4401 PT Time Calculation (min): 27 min  PT Assessment / Plan / Recommendation Comments on Treatment Session       Follow Up Recommendations  Home health PT     Does the patient have the potential to tolerate intense rehabilitation     Barriers to Discharge        Equipment Recommendations  Rolling walker with 5" wheels    Recommendations for Other Services OT consult  Frequency 7X/week   Plan Discharge plan remains appropriate    Precautions / Restrictions Precautions Precautions: None Restrictions Weight Bearing Restrictions: No Other Position/Activity Restrictions: WBAT   Pertinent Vitals/Pain 3/10; premed, ice pack provided    Mobility  Bed Mobility Supine to Sit: 5: Supervision (modified technique) Transfers Transfers: Sit to Stand;Stand to Sit Sit to Stand: 5: Supervision Stand to Sit: 4: Min guard;With upper extremity assist;With armrests;To chair/3-in-1 Details for Transfer Assistance: cues for hand placement Ambulation/Gait Ambulation/Gait Assistance: 4: Min guard;5: Supervision Ambulation Distance (Feet): 250 Feet Assistive device: Rolling walker Ambulation/Gait Assistance Details: cues for posture, position from RW and ER on R Gait Pattern: Step-to pattern;Step-through pattern Stairs: No    Exercises Total Joint Exercises Ankle Circles/Pumps: AROM;15 reps;Supine;Both Quad Sets: AROM;Both;10 reps;Supine Heel Slides: AAROM;15 reps;Supine;Right Hip ABduction/ADduction: AAROM;Right;15 reps;Supine   PT Diagnosis:    PT Problem List:   PT Treatment Interventions:     PT Goals Acute Rehab PT Goals PT Goal Formulation: With patient Time For Goal Achievement: 11/18/12 Potential to Achieve Goals: Good Pt will go Supine/Side to Sit: with supervision PT Goal: Supine/Side to Sit - Progress: Progressing toward goal Pt  will go Sit to Supine/Side: with supervision PT Goal: Sit to Supine/Side - Progress: Progressing toward goal Pt will go Sit to Stand: with supervision PT Goal: Sit to Stand - Progress: Progressing toward goal Pt will Ambulate: >150 feet;with supervision;with least restrictive assistive device PT Goal: Ambulate - Progress: Progressing toward goal Pt will Perform Home Exercise Program: with supervision, verbal cues required/provided PT Goal: Perform Home Exercise Program - Progress: Progressing toward goal  Visit Information  Last PT Received On: 11/16/12 Assistance Needed: +1    Subjective Data  Subjective: I'm hoping to go home today Patient Stated Goal: to return home.    Cognition  Cognition Overall Cognitive Status: Appears within functional limits for tasks assessed/performed Arousal/Alertness: Awake/alert Orientation Level: Appears intact for tasks assessed Behavior During Session: [facility] for tasks performed    Balance     End of Session PT - End of Session Activity Tolerance: Patient tolerated treatment well Patient left: in chair;with call bell/phone within reach;with family/visitor present Nurse Communication: Mobility status   GP     Eoin Willden 11/16/2012, 10:39 AM

## 2012-11-16 NOTE — Progress Notes (Signed)
Physical Therapy Treatment Patient Details Name: ALIMA NASER MRN: [id number] DOB: 1950/11/22 Today's Date: 11/16/2012 Time: 1152-1225 PT Time Calculation (min): 33 min  PT Assessment / Plan / Recommendation Comments on Treatment Session  Reviewed car transfers with pt    Follow Up Recommendations  Home health PT     Does the patient have the potential to tolerate intense rehabilitation     Barriers to Discharge        Equipment Recommendations  Rolling walker with 5" wheels    Recommendations for Other Services OT consult  Frequency 7X/week   Plan Discharge plan remains appropriate    Precautions / Restrictions Precautions Precautions: None Restrictions Weight Bearing Restrictions: No Other Position/Activity Restrictions: WBAT   Pertinent Vitals/Pain 3/10;    Mobility  Bed Mobility Bed Mobility: Sit to Supine Sit to Supine: 5: Supervision Details for Bed Mobility Assistance: min cues for sequence Transfers Transfers: Sit to Stand;Stand to Sit Sit to Stand: 5: Supervision Stand to Sit: 5: Supervision Details for Transfer Assistance: cues for hand placement Ambulation/Gait Ambulation/Gait Assistance: 4: Min guard;5: Supervision Ambulation Distance (Feet): 140 Feet Assistive device: Rolling walker Ambulation/Gait Assistance Details: min cues for posture, position from RW and ER on R Gait Pattern: Step-to pattern;Step-through pattern Stairs: Yes Stairs Assistance: 4: Min assist Stairs Assistance Details (indicate cue type and reason): cues for sequence and foot/RW placement Stair Management Technique: No rails;Step to pattern;With walker;Backwards Number of Stairs: 2  (twice and third time with pt's spouse)    Exercises     PT Diagnosis:    PT Problem List:   PT Treatment Interventions:     PT Goals Acute Rehab PT Goals PT Goal Formulation: With patient Time For Goal Achievement: 11/18/12 Potential to Achieve Goals: Good Pt will go Supine/Side to  Sit: with supervision PT Goal: Supine/Side to Sit - Progress: Progressing toward goal Pt will go Sit to Supine/Side: with supervision PT Goal: Sit to Supine/Side - Progress: Met Pt will go Sit to Stand: with supervision PT Goal: Sit to Stand - Progress: Met Pt will Ambulate: >150 feet;with supervision;with least restrictive assistive device PT Goal: Ambulate - Progress: Met Pt will Go Up / Down Stairs: 1-2 stairs;with supervision;with least restrictive assistive device PT Goal: Up/Down Stairs - Progress: Progressing toward goal Pt will Perform Home Exercise Program: with supervision, verbal cues required/provided PT Goal: Perform Home Exercise Program - Progress: Progressing toward goal  Visit Information  Last PT Received On: 11/16/12 Assistance Needed: +1    Subjective Data  Patient Stated Goal: to return home.    Cognition  Cognition Overall Cognitive Status: Appears within functional limits for tasks assessed/performed Arousal/Alertness: Awake/alert Orientation Level: Appears intact for tasks assessed Behavior During Session: [facility] for tasks performed    Balance     End of Session PT - End of Session Equipment Utilized During Treatment: Gait belt Activity Tolerance: Patient tolerated treatment well Patient left: in bed;with call bell/phone within reach Nurse Communication: Mobility status   GP     Tamarcus Condie 11/16/2012, 1:26 PM

## 2012-11-16 NOTE — Progress Notes (Signed)
Pt to d/c home with [facility]. RW and 3-n-1 delivered to room prior to d/c. AVS reviewed and "My Chart" discussed with pt. Pt capable of verbalizing medications and follow-up appointments. Remains hemodynamically stable. No signs and symptoms of distress. Educated pt to return to ER in the case of SOB, dizziness, or chest pain.

## 2012-11-22 NOTE — Discharge Summary (Signed)
Physician Discharge Summary  Patient ID: [name] MRN: [id number] DOB/AGE: March 20, 1951 62 y.o.  Admit date: 11/15/2012 Discharge date: 11/16/2012   Procedures:  Procedure(s) (LRB): TOTAL HIP ARTHROPLASTY ANTERIOR APPROACH (Right)  Attending Physician:  Dr. Durene Romans   Admission Diagnoses:   Right hip OA / pain  Discharge Diagnoses:  Principal Problem:   S/P right THA, AA Active Problems:   Expected blood loss anemia   Obesity (BMI 30-39.9)   Hyponatremia  Diagnosis  . Diabetes mellitus  . Hypertension  . High cholesterol  . Anxiety and depression  . Reflux  . Family history of anesthesia complication  . Arthritis  . GERD (gastroesophageal reflux disease)    HPI: [name], 62 y.o. female, has a history of pain and functional disability in the right hip(s) due to arthritis and patient has failed non-surgical conservative treatments for greater than 12 weeks to include NSAID's and/or analgesics, corticosteriod injections and activity modification. Onset of symptoms was gradual starting 4 years ago with gradually worsening course since that time.The patient noted no past surgery on the right hip(s). Patient currently rates pain in the right hip at 8 out of 10 with activity. Patient has night pain, worsening of pain with activity and weight bearing, trendelenberg gait, pain that interfers with activities of daily living and pain with passive range of motion. Patient has evidence of periarticular osteophytes and joint space narrowing by imaging studies. This condition presents safety issues increasing the risk of falls. There is no current active infection. Risks, benefits and expectations were discussed with the patient. Patient understand the risks, benefits and expectations and wishes to proceed with surgery.   PCP: Fredirick Maudlin, MD   Discharged Condition: good  Hospital Course:  Patient underwent the above stated procedure on 11/15/2012. Patient  tolerated the procedure well and brought to the recovery room in good condition and subsequently to the floor.  POD #1 BP: 102/69 ; Pulse: 79 ; Temp: 97.8 F (36.6 C) ; Resp: 15  Pt's foley was removed, as well as the hemovac drain removed. IV was changed to a saline lock. Patient reports pain as mild, pain well controlled. No events throughout the night. Ready to be discharged home if she does well with PT and pain well controlled.  Neurovascular intact, dorsiflexion/plantar flexion intact, incision: dressing C/D/I, no cellulitis present and compartment soft.   LABS  Basename  11/16/12 0355  HGB  10.3  HCT  29.4    Discharge Exam: General appearance: alert, cooperative and no distress Extremities: Homans sign is negative, no sign of DVT, no edema, redness or tenderness in the calves or thighs and no ulcers, gangrene or trophic changes  Disposition:   Home-Health Care Svc with follow up in 2 weeks   Follow-up Information   Follow up with [name], MD. Schedule an appointment as soon as possible for a visit in 2 weeks.   Contact information:   [address] [place] [address] [phone]       Discharge Orders   Future Orders Complete By Expires     Call MD / Call 911  As directed     Comments:      If you experience chest pain or shortness of breath, CALL 911 and be transported to the hospital emergency room.  If you develope a fever above 101 F, pus (white drainage) or increased drainage or redness at the wound, or calf pain, call your surgeon's office.  Change dressing  As directed     Comments:      Maintain surgical dressing for 10-14 days, then replace with 4x4 guaze and tape. Keep the area dry and clean.    Constipation Prevention  As directed     Comments:      Drink plenty of fluids.  Prune juice may be helpful.  You may use a stool softener, such as Colace (over the counter) 100 mg twice a day.  Use MiraLax (over the counter) for constipation  as needed.    Diet - low sodium heart healthy  As directed     Discharge instructions  As directed     Comments:      Maintain surgical dressing for 10-14 days, then replace with gauze and tape. Keep the area dry and clean until follow up. Follow up in 2 weeks at [facility]. Call with any questions or concerns.    Increase activity slowly as tolerated  As directed     TED hose  As directed     Comments:      Use stockings (TED hose) for 2 weeks on both leg(s).  You may remove them at night for sleeping.    Weight bearing as tolerated  As directed          Medication List    STOP taking these medications       meloxicam 15 MG tablet  Commonly known as:  MOBIC      TAKE these medications       aspirin EC 325 MG tablet  Take 1 tablet (325 mg total) by mouth 2 (two) times daily.     ATORVASTATIN CALCIUM PO  Take 40 mg by mouth every evening.     cyclobenzaprine 10 MG tablet  Commonly known as:  FLEXERIL  Take 1 tablet (10 mg total) by mouth 2 (two) times daily as needed for muscle spasms. SPASMS     CYMBALTA PO  Take 60 mg by mouth at bedtime.     DSS 100 MG Caps  Take 100 mg by mouth 2 (two) times daily.     ferrous sulfate 325 (65 FE) MG tablet  Take 1 tablet (325 mg total) by mouth 3 (three) times daily after meals.     gabapentin 100 MG capsule  Commonly known as:  NEURONTIN  Two caps by mouth three times daily     GLYBURIDE-METFORMIN PO  Take 5-500 mg by mouth 2 (two) times daily.     hydrochlorothiazide 12.5 MG tablet  Commonly known as:  HYDRODIURIL  Take 12.5 mg by mouth every morning.     HYDROcodone-acetaminophen 7.5-325 MG per tablet  Commonly known as:  NORCO  Take 1-2 tablets by mouth every 4 (four) hours as needed for pain.     IRBESARTAN PO  Take 150 mg by mouth every evening.     LORAZEPAM PO  Take 2 mg by mouth daily as needed. ANXIETY     methylphenidate 36 MG CR tablet  Commonly known as:  CONCERTA  Take 36 mg by mouth  daily with breakfast.     OMEPRAZOLE PO  Take 40 mg by mouth every evening.     polyethylene glycol packet  Commonly known as:  MIRALAX / GLYCOLAX  Take 17 g by mouth daily as needed.     VICTOZA 18 MG/3ML Soln injection  Generic drug:  Liraglutide  Inject 18 mg into the skin daily.  Signed: Anastasio Auerbach. Tremar Wickens   PAC  11/22/2012, 1:23 PM

## 2013-04-10 ENCOUNTER — Other Ambulatory Visit (HOSPITAL_COMMUNITY): Payer: Self-pay | Admitting: Pulmonary Disease

## 2013-04-10 DIAGNOSIS — Z139 Encounter for screening, unspecified: Secondary | ICD-10-CM

## 2013-04-11 ENCOUNTER — Ambulatory Visit (HOSPITAL_COMMUNITY)
Admission: RE | Admit: 2013-04-11 | Discharge: 2013-04-11 | Disposition: A | Discharge status: Home / Self Care | Payer: BC Managed Care – PPO | Source: Ambulatory Visit | Attending: Pulmonary Disease | Admitting: Pulmonary Disease

## 2013-04-11 DIAGNOSIS — Z139 Encounter for screening, unspecified: Secondary | ICD-10-CM

## 2013-04-11 DIAGNOSIS — Z1231 Encounter for screening mammogram for malignant neoplasm of breast: Secondary | ICD-10-CM | POA: Insufficient documentation

## 2013-04-27 ENCOUNTER — Encounter: Payer: Self-pay | Admitting: Obstetrics & Gynecology

## 2013-04-27 ENCOUNTER — Ambulatory Visit (INDEPENDENT_AMBULATORY_CARE_PROVIDER_SITE_OTHER): Payer: BC Managed Care – PPO | Admitting: Obstetrics & Gynecology

## 2013-04-27 ENCOUNTER — Other Ambulatory Visit (HOSPITAL_COMMUNITY)
Admission: RE | Admit: 2013-04-27 | Discharge: 2013-04-27 | Disposition: A | Discharge status: Home / Self Care | Payer: BC Managed Care – PPO | Source: Ambulatory Visit | Attending: Obstetrics & Gynecology | Admitting: Obstetrics & Gynecology

## 2013-04-27 VITALS — BP 110/80 | Ht 65.0 in | Wt 218.0 lb

## 2013-04-27 DIAGNOSIS — Z01419 Encounter for gynecological examination (general) (routine) without abnormal findings: Secondary | ICD-10-CM | POA: Insufficient documentation

## 2013-04-27 DIAGNOSIS — Z1151 Encounter for screening for human papillomavirus (HPV): Secondary | ICD-10-CM | POA: Insufficient documentation

## 2013-04-27 DIAGNOSIS — Z1212 Encounter for screening for malignant neoplasm of rectum: Secondary | ICD-10-CM

## 2013-04-27 LAB — HEMOCCULT GUIAC POC 1CARD (OFFICE): Fecal Occult Blood, POC: NEGATIVE

## 2013-04-27 NOTE — Patient Instructions (Addendum)

## 2013-04-27 NOTE — Progress Notes (Signed)
Patient ID: [name], female   DOB: 07/25/1951, 62 y.o.   MRN: [id number] Subjective:     [name] is a 62 y.o. female here for a routine exam.  No LMP recorded. Patient is postmenopausal. No obstetric history on file. Current complaints: none.  Personal health questionnaire reviewed: yes.   Gynecologic History No LMP recorded. Patient is postmenopausal. Contraception: post menopausal status Last Pap: 2013. Results were: normal Last mammogram: 2014. Results were: normal  Obstetric History OB History   Grav Para Term Preterm Abortions TAB SAB Ect Mult Living                   The following portions of the patient's history were reviewed and updated as appropriate: allergies, current medications, past family history, past medical history, past social history, past surgical history and problem list.  Review of Systems  Review of Systems  Constitutional: Negative for fever, chills, weight loss, malaise/fatigue and diaphoresis.  HENT: Negative for hearing loss, ear pain, nosebleeds, congestion, sore throat, neck pain, tinnitus and ear discharge.   Eyes: Negative for blurred vision, double vision, photophobia, pain, discharge and redness.  Respiratory: Negative for cough, hemoptysis, sputum production, shortness of breath, wheezing and stridor.   Cardiovascular: Negative for chest pain, palpitations, orthopnea, claudication, leg swelling and PND.  Gastrointestinal: negative for abdominal pain. Negative for heartburn, nausea, vomiting, diarrhea, constipation, blood in stool and melena.  Genitourinary: Negative for dysuria, urgency, frequency, hematuria and flank pain.  Musculoskeletal: Negative for myalgias, back pain, joint pain and falls.  Skin: Negative for itching and rash.  Neurological: Negative for dizziness, tingling, tremors, sensory change, speech change, focal weakness, seizures, loss of consciousness, weakness and headaches.  Endo/Heme/Allergies: Negative  for environmental allergies and polydipsia. Does not bruise/bleed easily.  Psychiatric/Behavioral: Negative for depression, suicidal ideas, hallucinations, memory loss and substance abuse. The patient is not nervous/anxious and does not have insomnia.        Objective:    Physical Exam  Vitals reviewed. Constitutional: She is oriented to person, place, and time. She appears well-developed and well-nourished.  HENT:  Head: Normocephalic and atraumatic.        Right Ear: External ear normal.  Left Ear: External ear normal.  Nose: Nose normal.  Mouth/Throat: Oropharynx is clear and moist.  Eyes: Conjunctivae and EOM are normal. Pupils are equal, round, and reactive to light. Right eye exhibits no discharge. Left eye exhibits no discharge. No scleral icterus.  Neck: Normal range of motion. Neck supple. No tracheal deviation present. No thyromegaly present.  Cardiovascular: Normal rate, regular rhythm, normal heart sounds and intact distal pulses.  Exam reveals no gallop and no friction rub.   No murmur heard. Respiratory: Effort normal and breath sounds normal. No respiratory distress. She has no wheezes. She has no rales. She exhibits no tenderness.  GI: Soft. Bowel sounds are normal. She exhibits no distension and no mass. There is no tenderness. There is no rebound and no guarding.  Genitourinary:       Vulva is normal without lesions Vagina is pink moist without discharge Cervix normal in appearance and pap is done Uterus is normal size shape and contour Adnexa is negative with normal sized ovaries not palpable Rectal hemeoccult negative no masses normal tone Musculoskeletal: Normal range of motion. She exhibits no edema and no tenderness.  Neurological: She is alert and oriented to person, place, and time. She has normal reflexes. She displays normal reflexes. No cranial nerve deficit. She exhibits  normal muscle tone. Coordination normal.  Skin: Skin is warm and dry. No rash noted. No  erythema. No pallor.  Psychiatric: She has a normal mood and affect. Her behavior is normal. Judgment and thought content normal.       Assessment:    Healthy female exam.    Plan:    Follow up in: 1 year.

## 2014-02-21 ENCOUNTER — Other Ambulatory Visit (HOSPITAL_COMMUNITY): Payer: Self-pay | Admitting: Pulmonary Disease

## 2014-02-21 DIAGNOSIS — Z780 Asymptomatic menopausal state: Secondary | ICD-10-CM

## 2014-03-06 ENCOUNTER — Other Ambulatory Visit (HOSPITAL_COMMUNITY): Payer: BC Managed Care – PPO

## 2014-03-08 ENCOUNTER — Ambulatory Visit (HOSPITAL_COMMUNITY)
Admission: RE | Admit: 2014-03-08 | Discharge: 2014-03-08 | Disposition: A | Discharge status: Home / Self Care | Payer: BC Managed Care – PPO | Source: Ambulatory Visit | Attending: Pulmonary Disease | Admitting: Pulmonary Disease

## 2014-03-08 DIAGNOSIS — Z780 Asymptomatic menopausal state: Secondary | ICD-10-CM | POA: Insufficient documentation

## 2014-05-06 IMAGING — CR DG CHEST 2V
2 series · 2 of 2 positions shown · non-contrast
Comparison: None.

CLINICAL DATA: Preop right hip replacement

CHEST - 2 VIEW

[w chest pa]
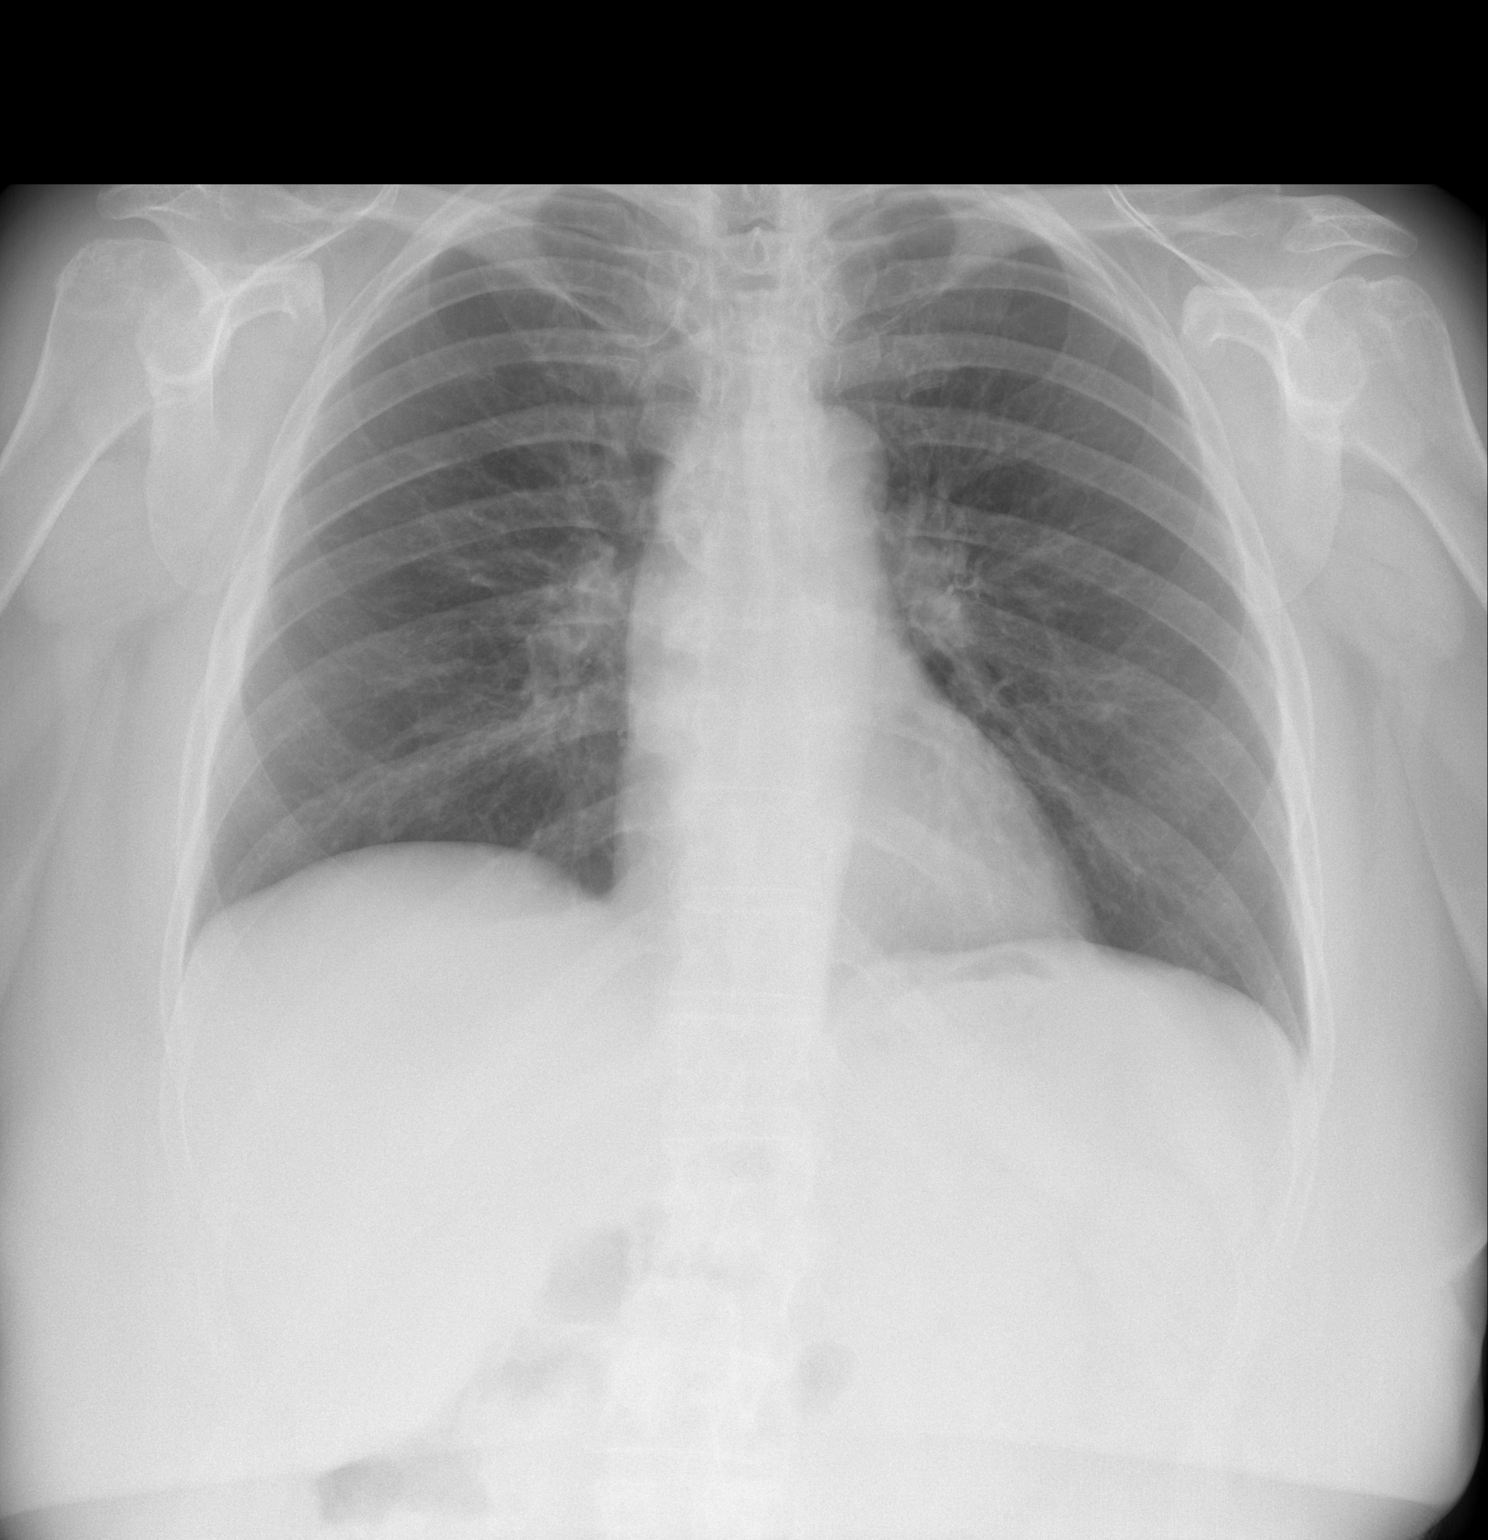

[w chest lat]
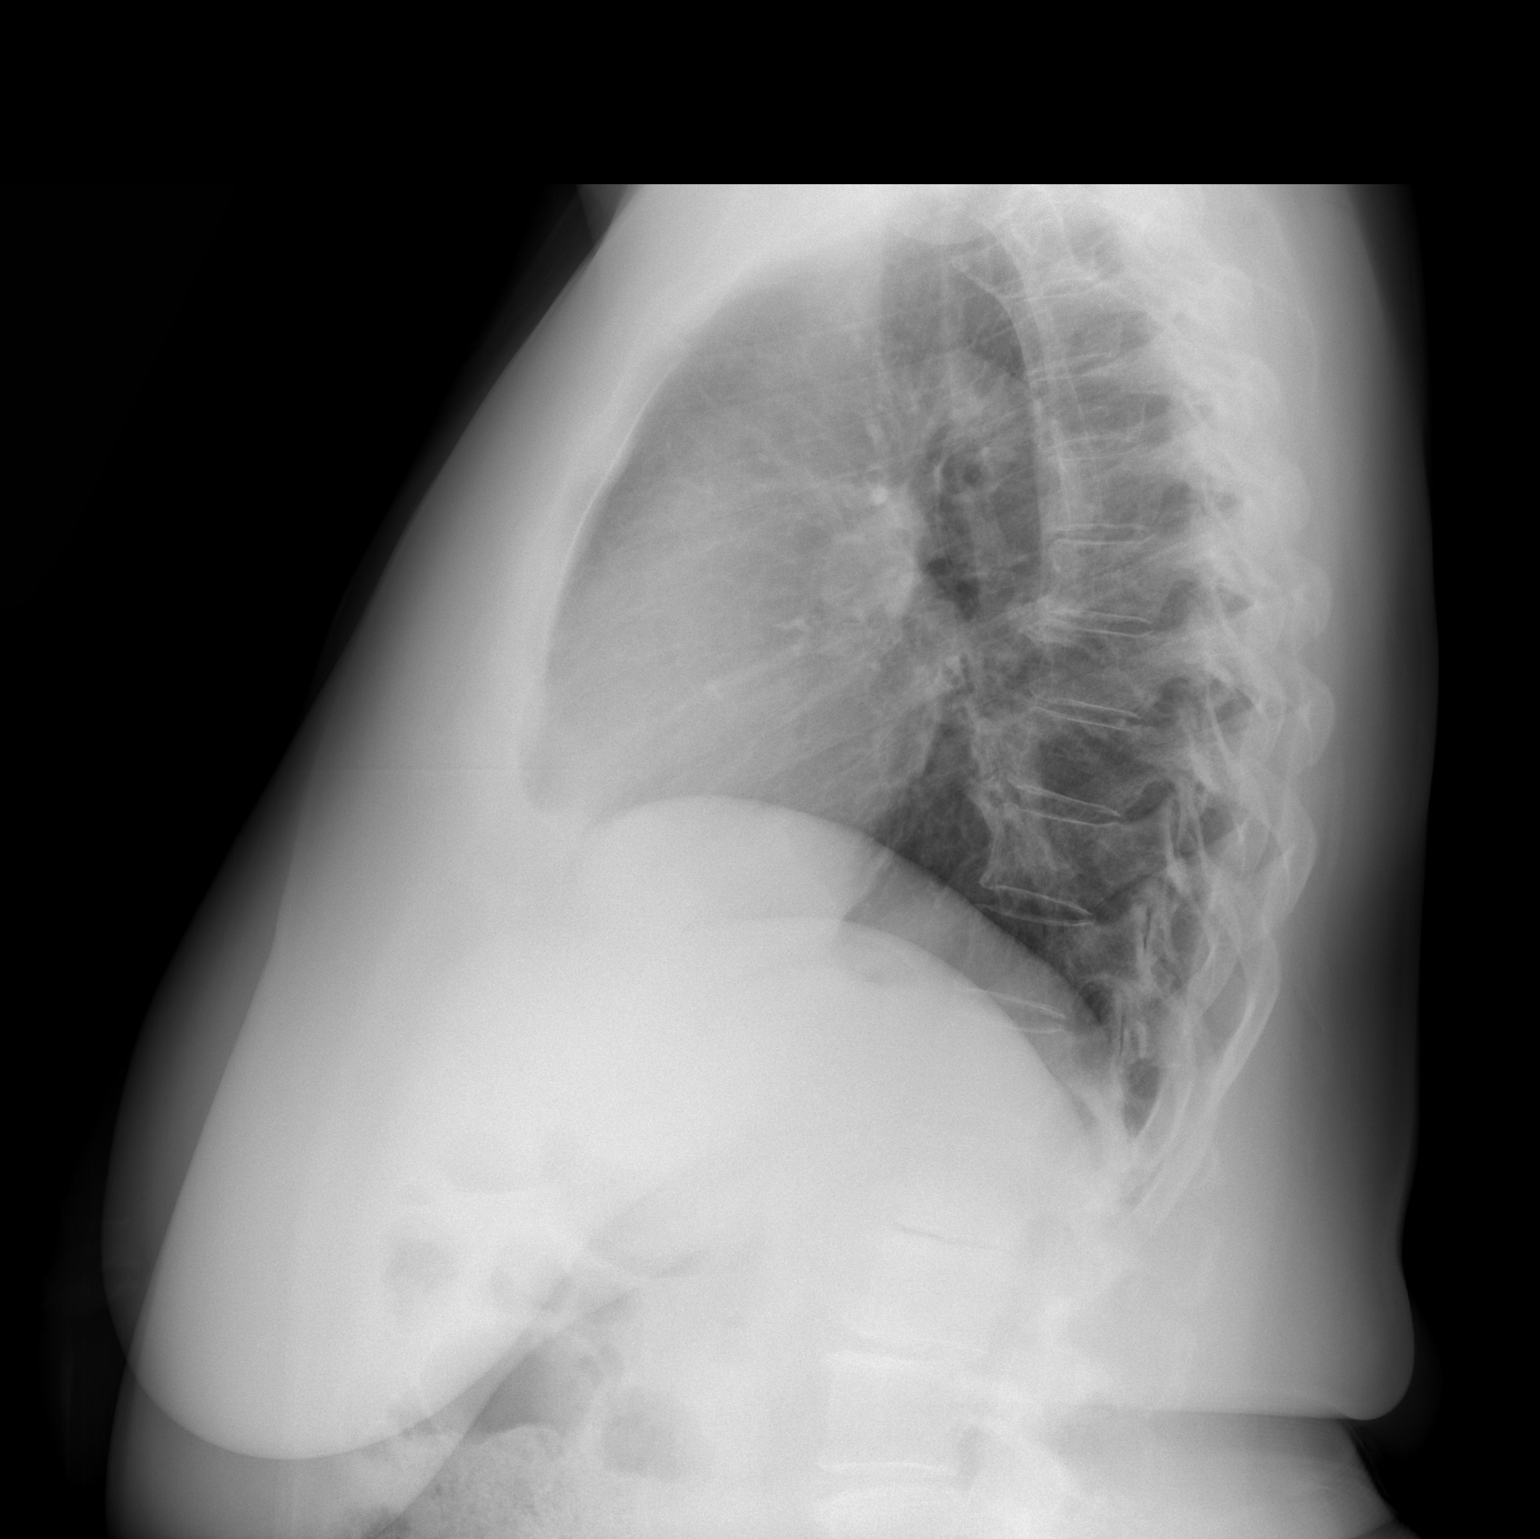

[2 of 2 positions shown; findings below may reference images not displayed]

FINDINGS: Normal mediastinum and cardiac silhouette.  Normal
pulmonary  vasculature.  No evidence of effusion, infiltrate, or
pneumothorax.  No acute bony abnormality.
IMPRESSION: No acute cardiopulmonary process.

## 2014-07-23 ENCOUNTER — Other Ambulatory Visit (HOSPITAL_COMMUNITY): Payer: Self-pay | Admitting: Pulmonary Disease

## 2014-07-23 DIAGNOSIS — Z1231 Encounter for screening mammogram for malignant neoplasm of breast: Secondary | ICD-10-CM

## 2014-07-27 ENCOUNTER — Ambulatory Visit (HOSPITAL_COMMUNITY)
Admission: RE | Admit: 2014-07-27 | Discharge: 2014-07-27 | Disposition: A | Discharge status: Home / Self Care | Payer: 59 | Source: Ambulatory Visit | Attending: Pulmonary Disease | Admitting: Pulmonary Disease

## 2014-07-27 DIAGNOSIS — Z1231 Encounter for screening mammogram for malignant neoplasm of breast: Secondary | ICD-10-CM | POA: Diagnosis not present

## 2014-08-02 ENCOUNTER — Ambulatory Visit (INDEPENDENT_AMBULATORY_CARE_PROVIDER_SITE_OTHER): Payer: Medicare Other | Admitting: Obstetrics & Gynecology

## 2014-08-02 ENCOUNTER — Encounter: Payer: Self-pay | Admitting: Obstetrics & Gynecology

## 2014-08-02 ENCOUNTER — Other Ambulatory Visit (HOSPITAL_COMMUNITY)
Admission: RE | Admit: 2014-08-02 | Discharge: 2014-08-02 | Disposition: A | Discharge status: Home / Self Care | Payer: 59 | Source: Ambulatory Visit | Attending: Obstetrics & Gynecology | Admitting: Obstetrics & Gynecology

## 2014-08-02 VITALS — BP 100/60 | Ht 64.4 in | Wt 218.0 lb

## 2014-08-02 DIAGNOSIS — Z01419 Encounter for gynecological examination (general) (routine) without abnormal findings: Secondary | ICD-10-CM

## 2014-08-02 DIAGNOSIS — Z1211 Encounter for screening for malignant neoplasm of colon: Secondary | ICD-10-CM

## 2014-08-02 DIAGNOSIS — Z1212 Encounter for screening for malignant neoplasm of rectum: Secondary | ICD-10-CM

## 2014-08-02 NOTE — Progress Notes (Signed)
Patient ID: [name], female   DOB: Jun 01, 1951, 63 y.o.   MRN: [id number] Subjective:     [name] is a 63 y.o. female here for a routine exam.  No LMP recorded. Patient is postmenopausal. No obstetric history on file. Birth Control Method:  na Menstrual Calendar(currently): na  Current complaints: none.   Current acute medical issues:  DM   Recent Gynecologic History No LMP recorded. Patient is postmenopausal. Last Pap: 2014,  normal Last mammogram: 2015,  normal  Past Medical History  Diagnosis Date  . Diabetes mellitus   . Hypertension   . High cholesterol   . Anxiety and depression   . Reflux   . Family history of anesthesia complication     nausea and vomiting  . Arthritis   . GERD (gastroesophageal reflux disease)     Past Surgical History  Procedure Laterality Date  . Hernia repair    . Total hip arthroplasty  11/15/2012    Procedure: TOTAL HIP ARTHROPLASTY ANTERIOR APPROACH;  Surgeon: Mauri Pole, MD;  Location: [facility];  Service: Orthopedics;  Laterality: Right;    OB History   Grav Para Term Preterm Abortions TAB SAB Ect Mult Living                  History   Social History  . Marital Status: Married    Spouse Name: N/A    Number of Children: N/A  . Years of Education: college   Social History Main Topics  . Smoking status: Former Smoker    Types: Cigarettes  . Smokeless tobacco: Never Used  . Alcohol Use: No     Comment: none x 5 years  . Drug Use: No  . Sexual Activity: Yes    Birth Control/ Protection: Post-menopausal   Other Topics Concern  . None   Social History Narrative  . None    Family History  Problem Relation Age of Onset  . Heart disease    . Arthritis    . Cancer    . Diabetes      Current outpatient prescriptions:DULoxetine (CYMBALTA) 60 MG capsule, Take 60 mg by mouth daily., Disp: , Rfl: ;  glyBURIDE-metformin (GLUCOVANCE) 5-500 MG per tablet, Take 1 tablet by mouth daily with breakfast. 2 po BID,  Disp: , Rfl: ;  hydrochlorothiazide (MICROZIDE) 12.5 MG capsule, Take 12.5 mg by mouth daily., Disp: , Rfl: ;  irbesartan (AVAPRO) 150 MG tablet, Take 150 mg by mouth at bedtime., Disp: , Rfl:  Liraglutide (VICTOZA [place]), Inject into the skin daily., Disp: , Rfl: ;  LORazepam (ATIVAN) 2 MG tablet, Take 2 mg by mouth daily., Disp: , Rfl: ;  methylphenidate (CONCERTA) 36 MG CR tablet, Take 36 mg by mouth every morning., Disp: , Rfl: ;  omeprazole (PRILOSEC) 40 MG capsule, Take 40 mg by mouth daily., Disp: , Rfl: ;  polyethylene glycol (MIRALAX / GLYCOLAX) packet, Take 17 g by mouth daily as needed., Disp: 14 each, Rfl:  aspirin EC 325 MG tablet, Take 1 tablet (325 mg total) by mouth 2 (two) times daily., Disp: 60 tablet, Rfl: 0;  atorvastatin (LIPITOR) 40 MG tablet, Take 40 mg by mouth daily., Disp: , Rfl: ;  cyclobenzaprine (FLEXERIL) 10 MG tablet, Take 1 tablet (10 mg total) by mouth 2 (two) times daily as needed for muscle spasms. SPASMS, Disp: 30 tablet, Rfl: 0;  docusate sodium 100 MG CAPS, Take 100 mg by mouth 2 (two) times daily., Disp: 10 capsule, Rfl:  ferrous sulfate 325 (65 FE) MG tablet, Take 1 tablet (325 mg total) by mouth 3 (three) times daily after meals., Disp: , Rfl: ;  gabapentin (NEURONTIN) 100 MG capsule, Two caps by mouth three times daily, Disp: 180 capsule, Rfl: 2;  HYDROcodone-acetaminophen (NORCO) 7.5-325 MG per tablet, Take 1-2 tablets by mouth every 4 (four) hours as needed for pain., Disp: 120 tablet, Rfl: 0 meloxicam (MOBIC) 15 MG tablet, Take 15 mg by mouth as needed for pain., Disp: , Rfl:   Review of Systems  Review of Systems  Constitutional: Negative for fever, chills, weight loss, malaise/fatigue and diaphoresis.  HENT: Negative for hearing loss, ear pain, nosebleeds, congestion, sore throat, neck pain, tinnitus and ear discharge.   Eyes: Negative for blurred vision, double vision, photophobia, pain, discharge and redness.  Respiratory: Negative for cough, hemoptysis,  sputum production, shortness of breath, wheezing and stridor.   Cardiovascular: Negative for chest pain, palpitations, orthopnea, claudication, leg swelling and PND.  Gastrointestinal: negative for abdominal pain. Negative for heartburn, nausea, vomiting, diarrhea, constipation, blood in stool and melena.  Genitourinary: Negative for dysuria, urgency, frequency, hematuria and flank pain.  Musculoskeletal: Negative for myalgias, back pain, joint pain and falls.  Skin: Negative for itching and rash.  Neurological: Negative for dizziness, tingling, tremors, sensory change, speech change, focal weakness, seizures, loss of consciousness, weakness and headaches.  Endo/Heme/Allergies: Negative for environmental allergies and polydipsia. Does not bruise/bleed easily.  Psychiatric/Behavioral: Negative for depression, suicidal ideas, hallucinations, memory loss and substance abuse. The patient is not nervous/anxious and does not have insomnia.        Objective:  Blood pressure 100/60, height 5' 4.4" (1.636 m), weight 218 lb (98.884 kg).   Physical Exam  Vitals reviewed. Constitutional: She is oriented to person, place, and time. She appears well-developed and well-nourished.  HENT:  Head: Normocephalic and atraumatic.        Right Ear: External ear normal.  Left Ear: External ear normal.  Nose: Nose normal.  Mouth/Throat: Oropharynx is clear and moist.  Eyes: Conjunctivae and EOM are normal. Pupils are equal, round, and reactive to light. Right eye exhibits no discharge. Left eye exhibits no discharge. No scleral icterus.  Neck: Normal range of motion. Neck supple. No tracheal deviation present. No thyromegaly present.  Cardiovascular: Normal rate, regular rhythm, normal heart sounds and intact distal pulses.  Exam reveals no gallop and no friction rub.   No murmur heard. Respiratory: Effort normal and breath sounds normal. No respiratory distress. She has no wheezes. She has no rales. She exhibits  no tenderness.  GI: Soft. Bowel sounds are normal. She exhibits no distension and no mass. There is no tenderness. There is no rebound and no guarding.  Genitourinary:  Breasts no masses skin changes or nipple changes bilaterally      Vulva is normal without lesions Vagina is pink moist without discharge Cervix normal in appearance and pap is done Uterus is normal size shape and contour Adnexa is negative with normal sized ovaries  Rectal    hemoccult negative, normal tone, no masses  Musculoskeletal: Normal range of motion. She exhibits no edema and no tenderness.  Neurological: She is alert and oriented to person, place, and time. She has normal reflexes. She displays normal reflexes. No cranial nerve deficit. She exhibits normal muscle tone. Coordination normal.  Skin: Skin is warm and dry. No rash noted. No erythema. No pallor.  Psychiatric: She has a normal mood and affect. Her behavior is normal. Judgment and thought  content normal.       Assessment:    normal post menopausal gyn exam.    Plan:    Follow up in: 3 years.

## 2014-08-06 LAB — CYTOLOGY - PAP

## 2014-12-04 ENCOUNTER — Encounter (INDEPENDENT_AMBULATORY_CARE_PROVIDER_SITE_OTHER): Payer: Self-pay | Admitting: *Deleted

## 2015-03-14 ENCOUNTER — Telehealth (INDEPENDENT_AMBULATORY_CARE_PROVIDER_SITE_OTHER): Payer: Self-pay | Admitting: *Deleted

## 2015-03-14 NOTE — Telephone Encounter (Signed)
Patient needs triilyte

## 2015-03-15 MED ORDER — PEG 3350-KCL-NA BICARB-NACL 420 G PO SOLR: 4000.0000 mL | Freq: Once | ORAL | Status: DC

## 2015-04-08 ENCOUNTER — Other Ambulatory Visit (INDEPENDENT_AMBULATORY_CARE_PROVIDER_SITE_OTHER): Payer: Self-pay | Admitting: *Deleted

## 2015-04-08 DIAGNOSIS — Z1211 Encounter for screening for malignant neoplasm of colon: Secondary | ICD-10-CM

## 2015-04-09 ENCOUNTER — Telehealth (INDEPENDENT_AMBULATORY_CARE_PROVIDER_SITE_OTHER): Payer: Self-pay | Admitting: *Deleted

## 2015-04-09 NOTE — Telephone Encounter (Signed)
Referring MD/PCP: hawkins   Procedure: tcs  Reason/Indication:  screening  Has patient had this procedure before?  Yes, 2006  If so, when, by whom and where?    Is there a family history of colon cancer?  no  Who?  What age when diagnosed?    Is patient diabetic?   yes      Does patient have prosthetic heart valve?  no  Do you have a pacemaker?  no  Has patient ever had endocarditis? no  Has patient had joint replacement within last 12 months?  no  Does patient tend to be constipated or take laxatives? no  Is patient on Coumadin, Plavix and/or Aspirin? no  Medications: duloxegna 60 mg daily, glip/metformin 5/500 mg 2 tab bid, victoza injection 18 mg daily, irbesartan 300 mg 1/2 tab daily, atorvastatin 40 mg daily, omeprazole 40 mg prn, hctz 25 mg daily, methylphenidate 36 mg daily, lorazepam 1 mg daily, levothyroxine 0.25 mg nightly  Allergies: pcn  Medication Adjustment: hold DM meds evening before and morning of procedure  Procedure date & time: 05/08/15 at 1200

## 2015-04-09 NOTE — Telephone Encounter (Signed)
agree

## 2015-05-08 ENCOUNTER — Encounter (HOSPITAL_COMMUNITY): Payer: Self-pay | Admitting: *Deleted

## 2015-05-08 ENCOUNTER — Ambulatory Visit (HOSPITAL_COMMUNITY)
Admission: RE | Admit: 2015-05-08 | Discharge: 2015-05-08 | Disposition: A | Discharge status: Home / Self Care | Payer: Medicare Other | Source: Ambulatory Visit | Attending: Internal Medicine | Admitting: Internal Medicine

## 2015-05-08 ENCOUNTER — Encounter (HOSPITAL_COMMUNITY)
Admission: RE | Disposition: A | Discharge status: Home / Self Care | Payer: Self-pay | Source: Ambulatory Visit | Attending: Internal Medicine

## 2015-05-08 DIAGNOSIS — D120 Benign neoplasm of cecum: Secondary | ICD-10-CM | POA: Insufficient documentation

## 2015-05-08 DIAGNOSIS — Z87891 Personal history of nicotine dependence: Secondary | ICD-10-CM | POA: Insufficient documentation

## 2015-05-08 DIAGNOSIS — K219 Gastro-esophageal reflux disease without esophagitis: Secondary | ICD-10-CM | POA: Insufficient documentation

## 2015-05-08 DIAGNOSIS — E780 Pure hypercholesterolemia: Secondary | ICD-10-CM | POA: Insufficient documentation

## 2015-05-08 DIAGNOSIS — I10 Essential (primary) hypertension: Secondary | ICD-10-CM | POA: Insufficient documentation

## 2015-05-08 DIAGNOSIS — Z1211 Encounter for screening for malignant neoplasm of colon: Secondary | ICD-10-CM | POA: Insufficient documentation

## 2015-05-08 DIAGNOSIS — K644 Residual hemorrhoidal skin tags: Secondary | ICD-10-CM | POA: Insufficient documentation

## 2015-05-08 DIAGNOSIS — K648 Other hemorrhoids: Secondary | ICD-10-CM | POA: Diagnosis not present

## 2015-05-08 DIAGNOSIS — E039 Hypothyroidism, unspecified: Secondary | ICD-10-CM | POA: Diagnosis not present

## 2015-05-08 DIAGNOSIS — Z79899 Other long term (current) drug therapy: Secondary | ICD-10-CM | POA: Insufficient documentation

## 2015-05-08 DIAGNOSIS — E119 Type 2 diabetes mellitus without complications: Secondary | ICD-10-CM | POA: Insufficient documentation

## 2015-05-08 DIAGNOSIS — F329 Major depressive disorder, single episode, unspecified: Secondary | ICD-10-CM | POA: Diagnosis not present

## 2015-05-08 DIAGNOSIS — M1990 Unspecified osteoarthritis, unspecified site: Secondary | ICD-10-CM | POA: Diagnosis not present

## 2015-05-08 DIAGNOSIS — F419 Anxiety disorder, unspecified: Secondary | ICD-10-CM | POA: Diagnosis not present

## 2015-05-08 HISTORY — PX: COLONOSCOPY: SHX5424

## 2015-05-08 HISTORY — DX: Hypothyroidism, unspecified: E03.9

## 2015-05-08 LAB — GLUCOSE, CAPILLARY: Glucose-Capillary: 158 mg/dL — ABNORMAL HIGH (ref 65–99)

## 2015-05-08 SURGERY — COLONOSCOPY
Anesthesia: Moderate Sedation

## 2015-05-08 MED ORDER — MIDAZOLAM HCL 5 MG/5ML IJ SOLN
INTRAMUSCULAR | Status: DC | PRN
  Administered 2015-05-08 (×3): 2 mg via INTRAVENOUS

## 2015-05-08 MED ORDER — MEPERIDINE HCL 50 MG/ML IJ SOLN
INTRAMUSCULAR | Status: DC
Start: 2015-05-08 — End: 2015-05-08
  Filled 2015-05-08: qty 1

## 2015-05-08 MED ORDER — SODIUM CHLORIDE 0.9 % IV SOLN
INTRAVENOUS | Status: DC
  Administered 2015-05-08: 12:00:00 via INTRAVENOUS

## 2015-05-08 MED ORDER — MEPERIDINE HCL 50 MG/ML IJ SOLN
INTRAMUSCULAR | Status: DC | PRN
  Administered 2015-05-08 (×2): 25 mg via INTRAVENOUS

## 2015-05-08 MED ORDER — MIDAZOLAM HCL 5 MG/5ML IJ SOLN
INTRAMUSCULAR | Status: AC
  Filled 2015-05-08: qty 10

## 2015-05-08 NOTE — Discharge Instructions (Signed)
Resume usual medications and diet. No driving for 24 hours. Physician will call with biopsy results. Colonoscopy, Care After Refer to this sheet in the next few weeks. These instructions provide you with information on caring for yourself after your procedure. Your health care provider may also give you more specific instructions. Your treatment has been planned according to current medical practices, but problems sometimes occur. Call your health care provider if you have any problems or questions after your procedure. WHAT TO EXPECT AFTER THE PROCEDURE  After your procedure, it is typical to have the following:  A small amount of blood in your stool.  Moderate amounts of gas and mild abdominal cramping or bloating. HOME CARE INSTRUCTIONS  Do not drive, operate machinery, or sign important documents for 24 hours.  You may shower and resume your regular physical activities, but move at a slower pace for the first 24 hours.  Take frequent rest periods for the first 24 hours.  Walk around or put a warm pack on your abdomen to help reduce abdominal cramping and bloating.  Drink enough fluids to keep your urine clear or pale yellow.  You may resume your normal diet as instructed by your health care provider. Avoid heavy or fried foods that are hard to digest.  Avoid drinking alcohol for 24 hours or as instructed by your health care provider.  Only take over-the-counter or prescription medicines as directed by your health care provider.  If a tissue sample (biopsy) was taken during your procedure:  Do not take aspirin or blood thinners for 7 days, or as instructed by your health care provider.  Do not drink alcohol for 7 days, or as instructed by your health care provider.  Eat soft foods for the first 24 hours. SEEK MEDICAL CARE IF: You have persistent spotting of blood in your stool 2-3 days after the procedure. SEEK IMMEDIATE MEDICAL CARE IF:  You have more than a small spotting  of blood in your stool.  You pass large blood clots in your stool.  Your abdomen is swollen (distended).  You have nausea or vomiting.  You have a fever.  You have increasing abdominal pain that is not relieved with medicine. Document Released: 05/12/2004 Document Revised: 07/19/2013 Document Reviewed: 06/05/2013 [facility] Patient Information 2015 [place], [place]. This information is not intended to replace advice given to you by your health care provider. Make sure you discuss any questions you have with your health care provider.  Colon Polyps Polyps are lumps of extra tissue growing inside the body. Polyps can grow in the large intestine (colon). Most colon polyps are noncancerous (benign). However, some colon polyps can become cancerous over time. Polyps that are larger than a pea may be harmful. To be safe, caregivers remove and test all polyps. CAUSES  Polyps form when mutations in the genes cause your cells to grow and divide even though no more tissue is needed. RISK FACTORS There are a number of risk factors that can increase your chances of getting colon polyps. They include:  Being older than 50 years.  Family history of colon polyps or colon cancer.  Long-term colon diseases, such as colitis or Crohn disease.  Being overweight.  Smoking.  Being inactive.  Drinking too much alcohol. SYMPTOMS  Most small polyps do not cause symptoms. If symptoms are present, they may include:  Blood in the stool. The stool may look dark red or black.  Constipation or diarrhea that lasts longer than 1 week. DIAGNOSIS People often  do not know they have polyps until their caregiver finds them during a regular checkup. Your caregiver can use 4 tests to check for polyps:  Digital rectal exam. The caregiver wears gloves and feels inside the rectum. This test would find polyps only in the rectum.  Barium enema. The caregiver puts a liquid called barium into your rectum before taking  X-rays of your colon. Barium makes your colon look white. Polyps are dark, so they are easy to see in the X-ray pictures.  Sigmoidoscopy. A thin, flexible tube (sigmoidoscope) is placed into your rectum. The sigmoidoscope has a light and tiny camera in it. The caregiver uses the sigmoidoscope to look at the last third of your colon.  Colonoscopy. This test is like sigmoidoscopy, but the caregiver looks at the entire colon. This is the most common method for finding and removing polyps. TREATMENT  Any polyps will be removed during a sigmoidoscopy or colonoscopy. The polyps are then tested for cancer. PREVENTION  To help lower your risk of getting more colon polyps:  Eat plenty of fruits and vegetables. Avoid eating fatty foods.  Do not smoke.  Avoid drinking alcohol.  Exercise every day.  Lose weight if recommended by your caregiver.  Eat plenty of calcium and folate. Foods that are rich in calcium include milk, cheese, and broccoli. Foods that are rich in folate include chickpeas, kidney beans, and spinach. HOME CARE INSTRUCTIONS Keep all follow-up appointments as directed by your caregiver. You may need periodic exams to check for polyps. SEEK MEDICAL CARE IF: You notice bleeding during a bowel movement. Document Released: 06/24/2004 Document Revised: 12/21/2011 Document Reviewed: 12/08/2011 [facility] Patient Information 2015 [place], [place]. This information is not intended to replace advice given to you by your health care provider. Make sure you discuss any questions you have with your health care provider.

## 2015-05-08 NOTE — Op Note (Signed)
COLONOSCOPY PROCEDURE REPORT  PATIENT:  [name]  MR#:  [id number] Birthdate:  16-Dec-1950, 64 y.o., female Endoscopist:  Dr. Rogene Houston, MD Referred By:  Dr. Oletta Lamas l. Luan Pulling, MD Procedure Date: 05/08/2015  Procedure:   Colonoscopy  Indications:  Patient is 29 old Caucasian female was undergoing average risk screening colonoscopy. Last colonoscopy was in March 2006.  Informed Consent:  The procedure and risks were reviewed with the patient and informed consent was obtained.  Medications:  Demerol 50 mg IV Versed 6  mg IV  Description of procedure:  After a digital rectal exam was performed, that colonoscope was advanced from the anus through the rectum and colon to the area of the cecum, ileocecal valve and appendiceal orifice. The cecum was deeply intubated. These structures were well-seen and photographed for the record. From the level of the cecum and ileocecal valve, the scope was slowly and cautiously withdrawn. The mucosal surfaces were carefully surveyed utilizing scope tip to flexion to facilitate fold flattening as needed. The scope was pulled down into the rectum where a thorough exam including retroflexion was performed.  Findings:   Prep satisfactory. Small cecal polyp ablated via cold biopsy. Mucosa of rest of the colon and rectum was normal. Small hemorrhoids below the dentate line.    Therapeutic/Diagnostic Maneuvers Performed:  See above  Complications:  None  Cecal Withdrawal Time:  15 minutes  Impression:  Examination performed to cecum. Small cecal polyp ablated via cold biopsy. External hemorrhoids.  Recommendations:  Standard instructions given. I will contact patient with biopsy results and further recommendations.  Meric Joye U  05/08/2015 12:31 PM  CC: Dr. Alonza Bogus, MD & Dr. Rayne Du ref. provider found

## 2015-05-08 NOTE — H&P (Signed)
[name] is an 64 y.o. female.   Chief Complaint: Patient is here for colonoscopy. HPI: Patient is 64 year old Caucasian female who is here for screening colonoscopy. She denies abdominal pain change in bowel habits or rectal bleeding. Last colonoscopy was normal other than hemorrhoids in March 2006. Family history is negative for CRC.  Past Medical History  Diagnosis Date  . Diabetes mellitus   . Hypertension   . High cholesterol   . Anxiety and depression   . Reflux   . Family history of anesthesia complication     nausea and vomiting  . Arthritis   . GERD (gastroesophageal reflux disease)   . Hypothyroidism     Past Surgical History  Procedure Laterality Date  . Hernia repair    . Total hip arthroplasty  11/15/2012    Procedure: TOTAL HIP ARTHROPLASTY ANTERIOR APPROACH;  Surgeon: Mauri Pole, MD;  Location: [facility];  Service: Orthopedics;  Laterality: Right;  . Colonoscopy  2006    Family History  Problem Relation Age of Onset  . Heart disease    . Arthritis    . Cancer    . Diabetes     Social History:  reports that she has quit smoking. Her smoking use included Cigarettes. She has never used smokeless tobacco. She reports that she does not drink alcohol or use illicit drugs.  Allergies:  Allergies  Allergen Reactions  . Penicillins Rash    Medications Prior to Admission  Medication Sig Dispense Refill  . atorvastatin (LIPITOR) 40 MG tablet Take 40 mg by mouth daily.    . DULoxetine (CYMBALTA) 60 MG capsule Take 60 mg by mouth daily.    Marland Kitchen glipiZIDE-metformin (METAGLIP) 5-500 MG per tablet Take 2 tablets by mouth 2 (two) times daily before a meal.    . glyBURIDE-metformin (GLUCOVANCE) 5-500 MG per tablet Take 2 tablets by mouth 2 (two) times daily with a meal. 2 po BID    . hydrochlorothiazide (HYDRODIURIL) 25 MG tablet Take 25 mg by mouth daily.    . irbesartan (AVAPRO) 150 MG tablet Take 150 mg by mouth at bedtime.    Marland Kitchen levothyroxine (SYNTHROID,  LEVOTHROID) 25 MCG tablet Take 25 mcg by mouth daily before breakfast.    . Liraglutide (VICTOZA [place]) Inject 1.8 mLs into the skin daily.     Marland Kitchen LORazepam (ATIVAN) 2 MG tablet Take 2 mg by mouth daily.    . methylphenidate (CONCERTA) 36 MG CR tablet Take 36 mg by mouth every morning.    Marland Kitchen omeprazole (PRILOSEC) 40 MG capsule Take 40 mg by mouth daily.    . polyethylene glycol-electrolytes (NULYTELY/GOLYTELY) 420 G solution Take 4,000 mLs by mouth once. 4000 mL 0  . gabapentin (NEURONTIN) 100 MG capsule Two caps by mouth three times daily 180 capsule 2    No results found for this or any previous visit (from the past 48 hour(s)). No results found.  ROS  Blood pressure 128/65, pulse 80, temperature 98.7 F (37.1 C), temperature source Oral, resp. rate 14, height 5\' 4"  (1.626 m), weight 224 lb (101.606 kg), SpO2 100 %. Physical Exam  Constitutional: She appears well-developed and well-nourished.  HENT:  Mouth/Throat: Oropharynx is clear and moist.  Eyes: Conjunctivae are normal. No scleral icterus.  Neck: No thyromegaly present.  Cardiovascular: Normal rate, regular rhythm and normal heart sounds.   No murmur heard. GI: Soft. She exhibits no distension. There is no tenderness.  Musculoskeletal: She exhibits no edema.  Lymphadenopathy:  She has no cervical adenopathy.  Neurological: She is alert.  Skin: Skin is warm and dry.     Assessment/Plan Average risk screening colonoscopy.  [name] U 05/08/2015, 11:48 AM

## 2015-05-09 ENCOUNTER — Encounter (HOSPITAL_COMMUNITY): Payer: Self-pay | Admitting: Internal Medicine

## 2015-05-21 ENCOUNTER — Encounter (INDEPENDENT_AMBULATORY_CARE_PROVIDER_SITE_OTHER): Payer: Self-pay | Admitting: *Deleted

## 2015-11-20 ENCOUNTER — Other Ambulatory Visit (HOSPITAL_COMMUNITY): Payer: Self-pay | Admitting: Pulmonary Disease

## 2015-11-20 DIAGNOSIS — Z1231 Encounter for screening mammogram for malignant neoplasm of breast: Secondary | ICD-10-CM

## 2015-11-25 ENCOUNTER — Ambulatory Visit (HOSPITAL_COMMUNITY)
Admission: RE | Admit: 2015-11-25 | Discharge: 2015-11-25 | Disposition: A | Discharge status: Home / Self Care | Payer: Medicare HMO | Source: Ambulatory Visit | Attending: Pulmonary Disease | Admitting: Pulmonary Disease

## 2015-11-25 DIAGNOSIS — Z1231 Encounter for screening mammogram for malignant neoplasm of breast: Secondary | ICD-10-CM | POA: Insufficient documentation

## 2015-12-03 DIAGNOSIS — E119 Type 2 diabetes mellitus without complications: Secondary | ICD-10-CM | POA: Diagnosis not present

## 2015-12-03 DIAGNOSIS — Z0000 Encounter for general adult medical examination without abnormal findings: Secondary | ICD-10-CM | POA: Diagnosis not present

## 2015-12-03 DIAGNOSIS — I10 Essential (primary) hypertension: Secondary | ICD-10-CM | POA: Diagnosis not present

## 2015-12-03 DIAGNOSIS — E669 Obesity, unspecified: Secondary | ICD-10-CM | POA: Diagnosis not present

## 2015-12-03 DIAGNOSIS — M545 Low back pain: Secondary | ICD-10-CM | POA: Diagnosis not present

## 2015-12-03 DIAGNOSIS — M25751 Osteophyte, right hip: Secondary | ICD-10-CM | POA: Diagnosis not present

## 2015-12-03 DIAGNOSIS — E039 Hypothyroidism, unspecified: Secondary | ICD-10-CM | POA: Diagnosis not present

## 2015-12-03 DIAGNOSIS — E785 Hyperlipidemia, unspecified: Secondary | ICD-10-CM | POA: Diagnosis not present

## 2015-12-03 DIAGNOSIS — R69 Illness, unspecified: Secondary | ICD-10-CM | POA: Diagnosis not present

## 2015-12-11 DIAGNOSIS — Z0000 Encounter for general adult medical examination without abnormal findings: Secondary | ICD-10-CM | POA: Diagnosis not present

## 2016-03-20 DIAGNOSIS — M545 Low back pain: Secondary | ICD-10-CM | POA: Diagnosis not present

## 2016-03-20 DIAGNOSIS — E119 Type 2 diabetes mellitus without complications: Secondary | ICD-10-CM | POA: Diagnosis not present

## 2016-03-20 DIAGNOSIS — E039 Hypothyroidism, unspecified: Secondary | ICD-10-CM | POA: Diagnosis not present

## 2016-03-20 DIAGNOSIS — E669 Obesity, unspecified: Secondary | ICD-10-CM | POA: Diagnosis not present

## 2016-03-20 DIAGNOSIS — R69 Illness, unspecified: Secondary | ICD-10-CM | POA: Diagnosis not present

## 2016-03-20 DIAGNOSIS — M25751 Osteophyte, right hip: Secondary | ICD-10-CM | POA: Diagnosis not present

## 2016-03-20 DIAGNOSIS — I10 Essential (primary) hypertension: Secondary | ICD-10-CM | POA: Diagnosis not present

## 2016-04-06 DIAGNOSIS — E039 Hypothyroidism, unspecified: Secondary | ICD-10-CM | POA: Diagnosis not present

## 2016-04-06 DIAGNOSIS — E669 Obesity, unspecified: Secondary | ICD-10-CM | POA: Diagnosis not present

## 2016-04-06 DIAGNOSIS — E119 Type 2 diabetes mellitus without complications: Secondary | ICD-10-CM | POA: Diagnosis not present

## 2016-04-06 DIAGNOSIS — I10 Essential (primary) hypertension: Secondary | ICD-10-CM | POA: Diagnosis not present

## 2016-07-08 DIAGNOSIS — E669 Obesity, unspecified: Secondary | ICD-10-CM | POA: Diagnosis not present

## 2016-07-08 DIAGNOSIS — E039 Hypothyroidism, unspecified: Secondary | ICD-10-CM | POA: Diagnosis not present

## 2016-07-08 DIAGNOSIS — E119 Type 2 diabetes mellitus without complications: Secondary | ICD-10-CM | POA: Diagnosis not present

## 2016-07-08 DIAGNOSIS — M25751 Osteophyte, right hip: Secondary | ICD-10-CM | POA: Diagnosis not present

## 2016-07-08 DIAGNOSIS — M545 Low back pain: Secondary | ICD-10-CM | POA: Diagnosis not present

## 2016-07-08 DIAGNOSIS — I10 Essential (primary) hypertension: Secondary | ICD-10-CM | POA: Diagnosis not present

## 2016-07-08 DIAGNOSIS — E785 Hyperlipidemia, unspecified: Secondary | ICD-10-CM | POA: Diagnosis not present

## 2016-07-08 DIAGNOSIS — R69 Illness, unspecified: Secondary | ICD-10-CM | POA: Diagnosis not present

## 2016-07-13 DIAGNOSIS — R69 Illness, unspecified: Secondary | ICD-10-CM | POA: Diagnosis not present

## 2016-07-14 DIAGNOSIS — E039 Hypothyroidism, unspecified: Secondary | ICD-10-CM | POA: Diagnosis not present

## 2016-07-14 DIAGNOSIS — I10 Essential (primary) hypertension: Secondary | ICD-10-CM | POA: Diagnosis not present

## 2016-07-14 DIAGNOSIS — E119 Type 2 diabetes mellitus without complications: Secondary | ICD-10-CM | POA: Diagnosis not present

## 2016-07-14 DIAGNOSIS — E785 Hyperlipidemia, unspecified: Secondary | ICD-10-CM | POA: Diagnosis not present

## 2016-07-28 DIAGNOSIS — R69 Illness, unspecified: Secondary | ICD-10-CM | POA: Diagnosis not present

## 2016-09-09 DIAGNOSIS — Z7984 Long term (current) use of oral hypoglycemic drugs: Secondary | ICD-10-CM | POA: Diagnosis not present

## 2016-09-09 DIAGNOSIS — D3131 Benign neoplasm of right choroid: Secondary | ICD-10-CM | POA: Diagnosis not present

## 2016-09-09 DIAGNOSIS — H2513 Age-related nuclear cataract, bilateral: Secondary | ICD-10-CM | POA: Diagnosis not present

## 2016-09-09 DIAGNOSIS — E119 Type 2 diabetes mellitus without complications: Secondary | ICD-10-CM | POA: Diagnosis not present

## 2016-10-27 DIAGNOSIS — E669 Obesity, unspecified: Secondary | ICD-10-CM | POA: Diagnosis not present

## 2016-10-27 DIAGNOSIS — E039 Hypothyroidism, unspecified: Secondary | ICD-10-CM | POA: Diagnosis not present

## 2016-10-27 DIAGNOSIS — R69 Illness, unspecified: Secondary | ICD-10-CM | POA: Diagnosis not present

## 2016-10-27 DIAGNOSIS — E785 Hyperlipidemia, unspecified: Secondary | ICD-10-CM | POA: Diagnosis not present

## 2016-10-27 DIAGNOSIS — E119 Type 2 diabetes mellitus without complications: Secondary | ICD-10-CM | POA: Diagnosis not present

## 2016-10-27 DIAGNOSIS — I10 Essential (primary) hypertension: Secondary | ICD-10-CM | POA: Diagnosis not present

## 2016-10-27 DIAGNOSIS — M545 Low back pain: Secondary | ICD-10-CM | POA: Diagnosis not present

## 2016-10-27 DIAGNOSIS — M25751 Osteophyte, right hip: Secondary | ICD-10-CM | POA: Diagnosis not present

## 2016-11-11 DIAGNOSIS — E039 Hypothyroidism, unspecified: Secondary | ICD-10-CM | POA: Diagnosis not present

## 2016-11-11 DIAGNOSIS — E1165 Type 2 diabetes mellitus with hyperglycemia: Secondary | ICD-10-CM | POA: Diagnosis not present

## 2016-11-11 DIAGNOSIS — I10 Essential (primary) hypertension: Secondary | ICD-10-CM | POA: Diagnosis not present

## 2016-11-11 DIAGNOSIS — R69 Illness, unspecified: Secondary | ICD-10-CM | POA: Diagnosis not present

## 2017-02-02 DIAGNOSIS — M545 Low back pain: Secondary | ICD-10-CM | POA: Diagnosis not present

## 2017-02-02 DIAGNOSIS — E785 Hyperlipidemia, unspecified: Secondary | ICD-10-CM | POA: Diagnosis not present

## 2017-02-02 DIAGNOSIS — E039 Hypothyroidism, unspecified: Secondary | ICD-10-CM | POA: Diagnosis not present

## 2017-02-02 DIAGNOSIS — Z0000 Encounter for general adult medical examination without abnormal findings: Secondary | ICD-10-CM | POA: Diagnosis not present

## 2017-02-02 DIAGNOSIS — E119 Type 2 diabetes mellitus without complications: Secondary | ICD-10-CM | POA: Diagnosis not present

## 2017-02-02 DIAGNOSIS — E669 Obesity, unspecified: Secondary | ICD-10-CM | POA: Diagnosis not present

## 2017-02-02 DIAGNOSIS — R69 Illness, unspecified: Secondary | ICD-10-CM | POA: Diagnosis not present

## 2017-02-02 DIAGNOSIS — M25751 Osteophyte, right hip: Secondary | ICD-10-CM | POA: Diagnosis not present

## 2017-02-02 DIAGNOSIS — I10 Essential (primary) hypertension: Secondary | ICD-10-CM | POA: Diagnosis not present

## 2017-02-08 DIAGNOSIS — Z0000 Encounter for general adult medical examination without abnormal findings: Secondary | ICD-10-CM | POA: Diagnosis not present

## 2017-04-22 DIAGNOSIS — R69 Illness, unspecified: Secondary | ICD-10-CM | POA: Diagnosis not present

## 2017-05-04 DIAGNOSIS — E039 Hypothyroidism, unspecified: Secondary | ICD-10-CM | POA: Diagnosis not present

## 2017-05-04 DIAGNOSIS — I10 Essential (primary) hypertension: Secondary | ICD-10-CM | POA: Diagnosis not present

## 2017-05-04 DIAGNOSIS — M25751 Osteophyte, right hip: Secondary | ICD-10-CM | POA: Diagnosis not present

## 2017-05-04 DIAGNOSIS — E119 Type 2 diabetes mellitus without complications: Secondary | ICD-10-CM | POA: Diagnosis not present

## 2017-05-04 DIAGNOSIS — R69 Illness, unspecified: Secondary | ICD-10-CM | POA: Diagnosis not present

## 2017-05-04 DIAGNOSIS — E669 Obesity, unspecified: Secondary | ICD-10-CM | POA: Diagnosis not present

## 2017-05-04 DIAGNOSIS — E785 Hyperlipidemia, unspecified: Secondary | ICD-10-CM | POA: Diagnosis not present

## 2017-05-04 DIAGNOSIS — M545 Low back pain: Secondary | ICD-10-CM | POA: Diagnosis not present

## 2017-05-07 DIAGNOSIS — E039 Hypothyroidism, unspecified: Secondary | ICD-10-CM | POA: Diagnosis not present

## 2017-05-07 DIAGNOSIS — E119 Type 2 diabetes mellitus without complications: Secondary | ICD-10-CM | POA: Diagnosis not present

## 2017-05-07 DIAGNOSIS — R69 Illness, unspecified: Secondary | ICD-10-CM | POA: Diagnosis not present

## 2017-05-07 DIAGNOSIS — I10 Essential (primary) hypertension: Secondary | ICD-10-CM | POA: Diagnosis not present

## 2017-07-21 DIAGNOSIS — R69 Illness, unspecified: Secondary | ICD-10-CM | POA: Diagnosis not present

## 2017-08-02 DIAGNOSIS — I10 Essential (primary) hypertension: Secondary | ICD-10-CM | POA: Diagnosis not present

## 2017-08-02 DIAGNOSIS — M545 Low back pain: Secondary | ICD-10-CM | POA: Diagnosis not present

## 2017-08-02 DIAGNOSIS — E669 Obesity, unspecified: Secondary | ICD-10-CM | POA: Diagnosis not present

## 2017-08-02 DIAGNOSIS — R69 Illness, unspecified: Secondary | ICD-10-CM | POA: Diagnosis not present

## 2017-08-02 DIAGNOSIS — E039 Hypothyroidism, unspecified: Secondary | ICD-10-CM | POA: Diagnosis not present

## 2017-08-02 DIAGNOSIS — M25751 Osteophyte, right hip: Secondary | ICD-10-CM | POA: Diagnosis not present

## 2017-08-02 DIAGNOSIS — E785 Hyperlipidemia, unspecified: Secondary | ICD-10-CM | POA: Diagnosis not present

## 2017-08-02 DIAGNOSIS — E119 Type 2 diabetes mellitus without complications: Secondary | ICD-10-CM | POA: Diagnosis not present

## 2017-08-05 DIAGNOSIS — I10 Essential (primary) hypertension: Secondary | ICD-10-CM | POA: Diagnosis not present

## 2017-08-05 DIAGNOSIS — E119 Type 2 diabetes mellitus without complications: Secondary | ICD-10-CM | POA: Diagnosis not present

## 2017-08-05 DIAGNOSIS — E039 Hypothyroidism, unspecified: Secondary | ICD-10-CM | POA: Diagnosis not present

## 2017-08-05 DIAGNOSIS — R69 Illness, unspecified: Secondary | ICD-10-CM | POA: Diagnosis not present

## 2017-09-14 DIAGNOSIS — R69 Illness, unspecified: Secondary | ICD-10-CM | POA: Diagnosis not present

## 2017-10-28 DIAGNOSIS — R69 Illness, unspecified: Secondary | ICD-10-CM | POA: Diagnosis not present

## 2017-11-01 DIAGNOSIS — I10 Essential (primary) hypertension: Secondary | ICD-10-CM | POA: Diagnosis not present

## 2017-11-01 DIAGNOSIS — M545 Low back pain: Secondary | ICD-10-CM | POA: Diagnosis not present

## 2017-11-01 DIAGNOSIS — M25751 Osteophyte, right hip: Secondary | ICD-10-CM | POA: Diagnosis not present

## 2017-11-01 DIAGNOSIS — E039 Hypothyroidism, unspecified: Secondary | ICD-10-CM | POA: Diagnosis not present

## 2017-11-01 DIAGNOSIS — E785 Hyperlipidemia, unspecified: Secondary | ICD-10-CM | POA: Diagnosis not present

## 2017-11-01 DIAGNOSIS — E119 Type 2 diabetes mellitus without complications: Secondary | ICD-10-CM | POA: Diagnosis not present

## 2017-11-01 DIAGNOSIS — E669 Obesity, unspecified: Secondary | ICD-10-CM | POA: Diagnosis not present

## 2017-11-01 DIAGNOSIS — R69 Illness, unspecified: Secondary | ICD-10-CM | POA: Diagnosis not present

## 2017-11-05 DIAGNOSIS — E039 Hypothyroidism, unspecified: Secondary | ICD-10-CM | POA: Diagnosis not present

## 2017-11-05 DIAGNOSIS — I10 Essential (primary) hypertension: Secondary | ICD-10-CM | POA: Diagnosis not present

## 2017-11-05 DIAGNOSIS — R69 Illness, unspecified: Secondary | ICD-10-CM | POA: Diagnosis not present

## 2017-11-05 DIAGNOSIS — E119 Type 2 diabetes mellitus without complications: Secondary | ICD-10-CM | POA: Diagnosis not present

## 2017-11-11 DIAGNOSIS — Z85828 Personal history of other malignant neoplasm of skin: Secondary | ICD-10-CM | POA: Diagnosis not present

## 2017-11-11 DIAGNOSIS — L578 Other skin changes due to chronic exposure to nonionizing radiation: Secondary | ICD-10-CM | POA: Diagnosis not present

## 2017-11-11 DIAGNOSIS — D225 Melanocytic nevi of trunk: Secondary | ICD-10-CM | POA: Diagnosis not present

## 2017-11-11 DIAGNOSIS — Z1283 Encounter for screening for malignant neoplasm of skin: Secondary | ICD-10-CM | POA: Diagnosis not present

## 2017-11-11 DIAGNOSIS — Z08 Encounter for follow-up examination after completed treatment for malignant neoplasm: Secondary | ICD-10-CM | POA: Diagnosis not present

## 2018-01-30 DIAGNOSIS — R69 Illness, unspecified: Secondary | ICD-10-CM | POA: Diagnosis not present

## 2018-02-07 DIAGNOSIS — R69 Illness, unspecified: Secondary | ICD-10-CM | POA: Diagnosis not present

## 2018-02-07 DIAGNOSIS — M25751 Osteophyte, right hip: Secondary | ICD-10-CM | POA: Diagnosis not present

## 2018-02-07 DIAGNOSIS — E119 Type 2 diabetes mellitus without complications: Secondary | ICD-10-CM | POA: Diagnosis not present

## 2018-02-07 DIAGNOSIS — E039 Hypothyroidism, unspecified: Secondary | ICD-10-CM | POA: Diagnosis not present

## 2018-02-07 DIAGNOSIS — E669 Obesity, unspecified: Secondary | ICD-10-CM | POA: Diagnosis not present

## 2018-02-07 DIAGNOSIS — E785 Hyperlipidemia, unspecified: Secondary | ICD-10-CM | POA: Diagnosis not present

## 2018-02-07 DIAGNOSIS — M545 Low back pain: Secondary | ICD-10-CM | POA: Diagnosis not present

## 2018-02-07 DIAGNOSIS — I10 Essential (primary) hypertension: Secondary | ICD-10-CM | POA: Diagnosis not present

## 2018-02-10 ENCOUNTER — Other Ambulatory Visit (HOSPITAL_COMMUNITY): Payer: Self-pay | Admitting: Pulmonary Disease

## 2018-02-10 DIAGNOSIS — Z780 Asymptomatic menopausal state: Secondary | ICD-10-CM

## 2018-02-10 DIAGNOSIS — Z0000 Encounter for general adult medical examination without abnormal findings: Secondary | ICD-10-CM | POA: Diagnosis not present

## 2018-02-25 ENCOUNTER — Encounter (HOSPITAL_COMMUNITY): Payer: Self-pay

## 2018-02-25 ENCOUNTER — Other Ambulatory Visit (HOSPITAL_COMMUNITY): Payer: Medicare Other

## 2018-03-14 ENCOUNTER — Ambulatory Visit (HOSPITAL_COMMUNITY)
Admission: RE | Admit: 2018-03-14 | Discharge: 2018-03-14 | Disposition: A | Discharge status: Home / Self Care | Payer: Medicare HMO | Source: Ambulatory Visit | Attending: Pulmonary Disease | Admitting: Pulmonary Disease

## 2018-03-14 DIAGNOSIS — Z13820 Encounter for screening for osteoporosis: Secondary | ICD-10-CM | POA: Diagnosis not present

## 2018-03-14 DIAGNOSIS — Z96641 Presence of right artificial hip joint: Secondary | ICD-10-CM | POA: Diagnosis not present

## 2018-03-14 DIAGNOSIS — Z780 Asymptomatic menopausal state: Secondary | ICD-10-CM | POA: Insufficient documentation

## 2018-05-12 DIAGNOSIS — M545 Low back pain: Secondary | ICD-10-CM | POA: Diagnosis not present

## 2018-05-12 DIAGNOSIS — R69 Illness, unspecified: Secondary | ICD-10-CM | POA: Diagnosis not present

## 2018-05-12 DIAGNOSIS — E669 Obesity, unspecified: Secondary | ICD-10-CM | POA: Diagnosis not present

## 2018-05-12 DIAGNOSIS — E039 Hypothyroidism, unspecified: Secondary | ICD-10-CM | POA: Diagnosis not present

## 2018-05-12 DIAGNOSIS — E119 Type 2 diabetes mellitus without complications: Secondary | ICD-10-CM | POA: Diagnosis not present

## 2018-05-12 DIAGNOSIS — E785 Hyperlipidemia, unspecified: Secondary | ICD-10-CM | POA: Diagnosis not present

## 2018-05-12 DIAGNOSIS — M25751 Osteophyte, right hip: Secondary | ICD-10-CM | POA: Diagnosis not present

## 2018-05-12 DIAGNOSIS — I10 Essential (primary) hypertension: Secondary | ICD-10-CM | POA: Diagnosis not present

## 2018-05-16 DIAGNOSIS — I10 Essential (primary) hypertension: Secondary | ICD-10-CM | POA: Diagnosis not present

## 2018-05-16 DIAGNOSIS — E119 Type 2 diabetes mellitus without complications: Secondary | ICD-10-CM | POA: Diagnosis not present

## 2018-05-16 DIAGNOSIS — R69 Illness, unspecified: Secondary | ICD-10-CM | POA: Diagnosis not present

## 2018-05-16 DIAGNOSIS — E669 Obesity, unspecified: Secondary | ICD-10-CM | POA: Diagnosis not present

## 2018-05-18 ENCOUNTER — Encounter (INDEPENDENT_AMBULATORY_CARE_PROVIDER_SITE_OTHER): Payer: Self-pay

## 2018-05-18 ENCOUNTER — Ambulatory Visit: Payer: Medicare HMO | Admitting: Adult Health

## 2018-05-18 ENCOUNTER — Encounter: Payer: Self-pay | Admitting: Adult Health

## 2018-05-18 ENCOUNTER — Other Ambulatory Visit (HOSPITAL_COMMUNITY)
Admission: RE | Admit: 2018-05-18 | Discharge: 2018-05-18 | Disposition: A | Discharge status: Home / Self Care | Payer: Medicare HMO | Source: Ambulatory Visit | Attending: Adult Health | Admitting: Adult Health

## 2018-05-18 VITALS — BP 121/73 | HR 89 | Ht 64.4 in | Wt 196.3 lb

## 2018-05-18 DIAGNOSIS — Z01419 Encounter for gynecological examination (general) (routine) without abnormal findings: Secondary | ICD-10-CM | POA: Insufficient documentation

## 2018-05-18 DIAGNOSIS — Z1212 Encounter for screening for malignant neoplasm of rectum: Secondary | ICD-10-CM

## 2018-05-18 DIAGNOSIS — Z1211 Encounter for screening for malignant neoplasm of colon: Secondary | ICD-10-CM | POA: Diagnosis not present

## 2018-05-18 LAB — HEMOCCULT GUIAC POC 1CARD (OFFICE): FECAL OCCULT BLD: NEGATIVE

## 2018-05-18 NOTE — Progress Notes (Signed)
Patient ID: [name], female   DOB: [birth year], 67 y.o.   MRN: [id number] History of Present Illness: [name] is a 67 year old white female, married, PM in for a well woman gyn exam and pap. PCP is Dr [name].    Current Medications, Allergies, Past Medical History, Past Surgical History, Family History and Social History were reviewed in [employer] record.     Review of Systems:  Patient denies any headaches, hearing loss, fatigue, blurred vision, shortness of breath, chest pain, abdominal pain, problems with bowel movements, urination, or intercourse.(not having sex). No joint pain or mood swings.   Physical Exam:BP 121/73 (BP Location: Left Arm, Patient Position: Sitting, Cuff Size: Normal)   Pulse 89   Ht 5' 4.4" (1.636 m)   Wt 196 lb 4.8 oz (89 kg)   BMI 33.28 kg/m  General:  Well developed, well nourished, no acute distress Skin:  Warm and dry Neck:  Midline trachea, normal thyroid, good ROM, no lymphadenopathy,no carotid bruits heard Lungs; Clear to auscultation bilaterally Breast:  No dominant palpable mass, retraction, or nipple discharge Cardiovascular: Regular rate and rhythm Abdomen:  Soft, non tender, no hepatosplenomegaly Pelvic:  External genitalia is normal in appearance, no lesions.  The vagina is normal in appearance for age, pale with loss of moisture and rugae. Urethra has no lesions or masses. The cervix is smooth, pap with HPV performed.  Uterus is felt to be normal size, shape, and contour.  No adnexal masses or tenderness noted.Bladder is non tender, no masses felt. Rectal: Good sphincter tone, no polyps, or hemorrhoids felt.  Hemoccult negative. Extremities/musculoskeletal:  No swelling or varicosities noted, no clubbing or cyanosis Psych:  No mood changes, alert and cooperative,seems happy PHQ 9 score 5, denies being suicidal, is on meds.  Impression: 1. Encounter for gynecological examination with Papanicolaou smear of  cervix   2. Screening for colorectal cancer       Plan: Physical in 2 years, and  Pap then if desired Mammogram now  Colonoscopy 2021 Labs with PCP

## 2018-05-18 NOTE — Addendum Note (Signed)
Addended by: Diona Fanti A on: 05/18/2018 02:48 PM   Modules accepted: Orders

## 2018-05-20 LAB — CYTOLOGY - PAP
Diagnosis: NEGATIVE
HPV: DETECTED — AB

## 2018-05-24 ENCOUNTER — Encounter: Payer: Self-pay | Admitting: Adult Health

## 2018-05-24 ENCOUNTER — Telehealth: Payer: Self-pay | Admitting: Adult Health

## 2018-05-24 DIAGNOSIS — R87810 Cervical high risk human papillomavirus (HPV) DNA test positive: Secondary | ICD-10-CM

## 2018-05-24 HISTORY — DX: Cervical high risk human papillomavirus (HPV) DNA test positive: R87.810

## 2018-05-24 NOTE — Telephone Encounter (Signed)
Pt aware that pap is negative for malignancy but +HPV, repeat in 1 year,put in recall.

## 2018-09-11 DIAGNOSIS — R69 Illness, unspecified: Secondary | ICD-10-CM | POA: Diagnosis not present

## 2018-09-16 ENCOUNTER — Other Ambulatory Visit (HOSPITAL_COMMUNITY): Payer: Self-pay | Admitting: Pulmonary Disease

## 2018-09-16 DIAGNOSIS — Z1231 Encounter for screening mammogram for malignant neoplasm of breast: Secondary | ICD-10-CM

## 2018-09-19 DIAGNOSIS — M545 Low back pain: Secondary | ICD-10-CM | POA: Diagnosis not present

## 2018-09-19 DIAGNOSIS — M9903 Segmental and somatic dysfunction of lumbar region: Secondary | ICD-10-CM | POA: Diagnosis not present

## 2018-09-19 DIAGNOSIS — M9902 Segmental and somatic dysfunction of thoracic region: Secondary | ICD-10-CM | POA: Diagnosis not present

## 2018-09-19 DIAGNOSIS — M9905 Segmental and somatic dysfunction of pelvic region: Secondary | ICD-10-CM | POA: Diagnosis not present

## 2018-09-22 DIAGNOSIS — M9902 Segmental and somatic dysfunction of thoracic region: Secondary | ICD-10-CM | POA: Diagnosis not present

## 2018-09-22 DIAGNOSIS — M9903 Segmental and somatic dysfunction of lumbar region: Secondary | ICD-10-CM | POA: Diagnosis not present

## 2018-09-22 DIAGNOSIS — M9905 Segmental and somatic dysfunction of pelvic region: Secondary | ICD-10-CM | POA: Diagnosis not present

## 2018-09-22 DIAGNOSIS — M545 Low back pain: Secondary | ICD-10-CM | POA: Diagnosis not present

## 2018-09-28 ENCOUNTER — Ambulatory Visit (HOSPITAL_COMMUNITY)
Admission: RE | Admit: 2018-09-28 | Discharge: 2018-09-28 | Disposition: A | Discharge status: Home / Self Care | Payer: Medicare HMO | Source: Ambulatory Visit | Attending: Pulmonary Disease | Admitting: Pulmonary Disease

## 2018-09-28 DIAGNOSIS — Z1231 Encounter for screening mammogram for malignant neoplasm of breast: Secondary | ICD-10-CM

## 2018-09-28 DIAGNOSIS — M9902 Segmental and somatic dysfunction of thoracic region: Secondary | ICD-10-CM | POA: Diagnosis not present

## 2018-09-28 DIAGNOSIS — M9903 Segmental and somatic dysfunction of lumbar region: Secondary | ICD-10-CM | POA: Diagnosis not present

## 2018-09-28 DIAGNOSIS — M545 Low back pain: Secondary | ICD-10-CM | POA: Diagnosis not present

## 2018-09-28 DIAGNOSIS — M9905 Segmental and somatic dysfunction of pelvic region: Secondary | ICD-10-CM | POA: Diagnosis not present

## 2018-09-30 DIAGNOSIS — M9903 Segmental and somatic dysfunction of lumbar region: Secondary | ICD-10-CM | POA: Diagnosis not present

## 2018-09-30 DIAGNOSIS — M9902 Segmental and somatic dysfunction of thoracic region: Secondary | ICD-10-CM | POA: Diagnosis not present

## 2018-09-30 DIAGNOSIS — M545 Low back pain: Secondary | ICD-10-CM | POA: Diagnosis not present

## 2018-09-30 DIAGNOSIS — M9905 Segmental and somatic dysfunction of pelvic region: Secondary | ICD-10-CM | POA: Diagnosis not present

## 2018-10-06 DIAGNOSIS — M9905 Segmental and somatic dysfunction of pelvic region: Secondary | ICD-10-CM | POA: Diagnosis not present

## 2018-10-06 DIAGNOSIS — M9903 Segmental and somatic dysfunction of lumbar region: Secondary | ICD-10-CM | POA: Diagnosis not present

## 2018-10-06 DIAGNOSIS — M545 Low back pain: Secondary | ICD-10-CM | POA: Diagnosis not present

## 2018-10-06 DIAGNOSIS — M9902 Segmental and somatic dysfunction of thoracic region: Secondary | ICD-10-CM | POA: Diagnosis not present

## 2018-10-07 DIAGNOSIS — M9903 Segmental and somatic dysfunction of lumbar region: Secondary | ICD-10-CM | POA: Diagnosis not present

## 2018-10-07 DIAGNOSIS — M545 Low back pain: Secondary | ICD-10-CM | POA: Diagnosis not present

## 2018-10-07 DIAGNOSIS — M9905 Segmental and somatic dysfunction of pelvic region: Secondary | ICD-10-CM | POA: Diagnosis not present

## 2018-10-07 DIAGNOSIS — M9902 Segmental and somatic dysfunction of thoracic region: Secondary | ICD-10-CM | POA: Diagnosis not present

## 2018-10-10 DIAGNOSIS — E119 Type 2 diabetes mellitus without complications: Secondary | ICD-10-CM | POA: Diagnosis not present

## 2018-10-10 DIAGNOSIS — I10 Essential (primary) hypertension: Secondary | ICD-10-CM | POA: Diagnosis not present

## 2018-10-10 DIAGNOSIS — R69 Illness, unspecified: Secondary | ICD-10-CM | POA: Diagnosis not present

## 2018-10-10 DIAGNOSIS — M545 Low back pain: Secondary | ICD-10-CM | POA: Diagnosis not present

## 2018-10-13 DIAGNOSIS — M9902 Segmental and somatic dysfunction of thoracic region: Secondary | ICD-10-CM | POA: Diagnosis not present

## 2018-10-13 DIAGNOSIS — M545 Low back pain: Secondary | ICD-10-CM | POA: Diagnosis not present

## 2018-10-13 DIAGNOSIS — M9905 Segmental and somatic dysfunction of pelvic region: Secondary | ICD-10-CM | POA: Diagnosis not present

## 2018-10-13 DIAGNOSIS — M9903 Segmental and somatic dysfunction of lumbar region: Secondary | ICD-10-CM | POA: Diagnosis not present

## 2018-10-14 DIAGNOSIS — M545 Low back pain: Secondary | ICD-10-CM | POA: Diagnosis not present

## 2018-10-14 DIAGNOSIS — E785 Hyperlipidemia, unspecified: Secondary | ICD-10-CM | POA: Diagnosis not present

## 2018-10-14 DIAGNOSIS — E669 Obesity, unspecified: Secondary | ICD-10-CM | POA: Diagnosis not present

## 2018-10-14 DIAGNOSIS — M25751 Osteophyte, right hip: Secondary | ICD-10-CM | POA: Diagnosis not present

## 2018-10-14 DIAGNOSIS — E119 Type 2 diabetes mellitus without complications: Secondary | ICD-10-CM | POA: Diagnosis not present

## 2018-10-14 DIAGNOSIS — I10 Essential (primary) hypertension: Secondary | ICD-10-CM | POA: Diagnosis not present

## 2018-10-14 DIAGNOSIS — E039 Hypothyroidism, unspecified: Secondary | ICD-10-CM | POA: Diagnosis not present

## 2018-10-14 DIAGNOSIS — R69 Illness, unspecified: Secondary | ICD-10-CM | POA: Diagnosis not present

## 2018-10-17 DIAGNOSIS — M9905 Segmental and somatic dysfunction of pelvic region: Secondary | ICD-10-CM | POA: Diagnosis not present

## 2018-10-17 DIAGNOSIS — M9903 Segmental and somatic dysfunction of lumbar region: Secondary | ICD-10-CM | POA: Diagnosis not present

## 2018-10-17 DIAGNOSIS — M9902 Segmental and somatic dysfunction of thoracic region: Secondary | ICD-10-CM | POA: Diagnosis not present

## 2018-10-17 DIAGNOSIS — M545 Low back pain: Secondary | ICD-10-CM | POA: Diagnosis not present

## 2018-12-12 ENCOUNTER — Encounter: Payer: Self-pay | Admitting: Adult Health

## 2018-12-12 ENCOUNTER — Other Ambulatory Visit: Payer: Self-pay

## 2018-12-12 ENCOUNTER — Ambulatory Visit: Payer: Medicare HMO | Admitting: Adult Health

## 2018-12-12 VITALS — BP 134/84 | HR 90 | Ht 64.75 in | Wt 209.0 lb

## 2018-12-12 DIAGNOSIS — R87810 Cervical high risk human papillomavirus (HPV) DNA test positive: Secondary | ICD-10-CM | POA: Diagnosis not present

## 2018-12-12 DIAGNOSIS — R69 Illness, unspecified: Secondary | ICD-10-CM | POA: Diagnosis not present

## 2018-12-12 NOTE — Progress Notes (Signed)
Patient ID: [name], female   DOB: 07/13/1951, 68 y.o.   MRN: [id number] History of Present Illness:  [name] is a 68 year old white female in to discuss HPV.Has +HPV on 05/18/18 pap, she did not want to wait til August to talk about it, she has done some research. H PCP is Dr Luan Pulling.   Current Medications, Allergies, Past Medical History, Past Surgical History, Family History and Social History were reviewed in [employer] record.     Review of Systems:  No spotting, itching or burning, just wants to talk about +HPV on pap   Physical Exam:BP 134/84 (BP Location: Right Arm, Patient Position: Sitting, Cuff Size: Normal)   Pulse 90   Ht 5' 4.75" (1.645 m)   Wt 209 lb (94.8 kg)   BMI 35.05 kg/m  General:  Well developed, well nourished, no acute distress Skin:  Warm and dry Psych:  No mood changes, alert and cooperative,seems happy Talk only:had +HPV on 05/18/18 pap, was negative for malignancy, had negative pap with negative HPV in 2014. Discussed HPV,she is aware that she was probably exposed, after 2014, but it can lay dormant.  Will repeat pap with HPV in August.If has +HPV will get colpo to look closer at cervix.  Face time 15 minutes counseling.   Impression: 1. Papanicolaou smear of cervix with positive high risk human papilloma virus (HPV) test       Plan: Given handouts on HPV, abnormal paps and colpo. Return about 05/22/2019 for pap with HPV

## 2019-02-21 DIAGNOSIS — E119 Type 2 diabetes mellitus without complications: Secondary | ICD-10-CM | POA: Diagnosis not present

## 2019-02-21 DIAGNOSIS — R69 Illness, unspecified: Secondary | ICD-10-CM | POA: Diagnosis not present

## 2019-02-21 DIAGNOSIS — I10 Essential (primary) hypertension: Secondary | ICD-10-CM | POA: Diagnosis not present

## 2019-03-22 DIAGNOSIS — Z0000 Encounter for general adult medical examination without abnormal findings: Secondary | ICD-10-CM | POA: Diagnosis not present

## 2019-03-28 ENCOUNTER — Other Ambulatory Visit: Payer: Self-pay

## 2019-03-28 ENCOUNTER — Other Ambulatory Visit (HOSPITAL_COMMUNITY): Payer: Self-pay | Admitting: Pulmonary Disease

## 2019-03-28 ENCOUNTER — Ambulatory Visit (HOSPITAL_COMMUNITY)
Admission: RE | Admit: 2019-03-28 | Discharge: 2019-03-28 | Disposition: A | Discharge status: Home / Self Care | Payer: Medicare HMO | Source: Ambulatory Visit | Attending: Pulmonary Disease | Admitting: Pulmonary Disease

## 2019-03-28 DIAGNOSIS — M25751 Osteophyte, right hip: Secondary | ICD-10-CM | POA: Diagnosis not present

## 2019-03-28 DIAGNOSIS — E785 Hyperlipidemia, unspecified: Secondary | ICD-10-CM | POA: Diagnosis not present

## 2019-03-28 DIAGNOSIS — M79641 Pain in right hand: Secondary | ICD-10-CM | POA: Diagnosis not present

## 2019-03-28 DIAGNOSIS — I10 Essential (primary) hypertension: Secondary | ICD-10-CM | POA: Diagnosis not present

## 2019-03-28 DIAGNOSIS — M19041 Primary osteoarthritis, right hand: Secondary | ICD-10-CM | POA: Diagnosis not present

## 2019-03-28 DIAGNOSIS — M79642 Pain in left hand: Secondary | ICD-10-CM | POA: Insufficient documentation

## 2019-03-28 DIAGNOSIS — M1812 Unilateral primary osteoarthritis of first carpometacarpal joint, left hand: Secondary | ICD-10-CM | POA: Diagnosis not present

## 2019-03-28 DIAGNOSIS — E669 Obesity, unspecified: Secondary | ICD-10-CM | POA: Diagnosis not present

## 2019-03-28 DIAGNOSIS — E039 Hypothyroidism, unspecified: Secondary | ICD-10-CM | POA: Diagnosis not present

## 2019-03-28 DIAGNOSIS — R69 Illness, unspecified: Secondary | ICD-10-CM | POA: Diagnosis not present

## 2019-03-28 DIAGNOSIS — M545 Low back pain: Secondary | ICD-10-CM | POA: Diagnosis not present

## 2019-03-28 DIAGNOSIS — E119 Type 2 diabetes mellitus without complications: Secondary | ICD-10-CM | POA: Diagnosis not present

## 2019-03-28 DIAGNOSIS — M19042 Primary osteoarthritis, left hand: Secondary | ICD-10-CM | POA: Diagnosis not present

## 2019-03-28 DIAGNOSIS — Z0000 Encounter for general adult medical examination without abnormal findings: Secondary | ICD-10-CM | POA: Diagnosis not present

## 2019-04-10 DIAGNOSIS — R69 Illness, unspecified: Secondary | ICD-10-CM | POA: Diagnosis not present

## 2019-05-25 ENCOUNTER — Other Ambulatory Visit: Payer: Self-pay | Admitting: Adult Health

## 2019-06-22 DIAGNOSIS — E039 Hypothyroidism, unspecified: Secondary | ICD-10-CM | POA: Diagnosis not present

## 2019-06-22 DIAGNOSIS — I10 Essential (primary) hypertension: Secondary | ICD-10-CM | POA: Diagnosis not present

## 2019-06-22 DIAGNOSIS — E119 Type 2 diabetes mellitus without complications: Secondary | ICD-10-CM | POA: Diagnosis not present

## 2019-06-22 DIAGNOSIS — Z23 Encounter for immunization: Secondary | ICD-10-CM | POA: Diagnosis not present

## 2019-06-22 DIAGNOSIS — E785 Hyperlipidemia, unspecified: Secondary | ICD-10-CM | POA: Diagnosis not present

## 2019-06-26 DIAGNOSIS — S134XXA Sprain of ligaments of cervical spine, initial encounter: Secondary | ICD-10-CM | POA: Diagnosis not present

## 2019-06-26 DIAGNOSIS — S233XXA Sprain of ligaments of thoracic spine, initial encounter: Secondary | ICD-10-CM | POA: Diagnosis not present

## 2019-06-26 DIAGNOSIS — M9901 Segmental and somatic dysfunction of cervical region: Secondary | ICD-10-CM | POA: Diagnosis not present

## 2019-06-28 DIAGNOSIS — S233XXA Sprain of ligaments of thoracic spine, initial encounter: Secondary | ICD-10-CM | POA: Diagnosis not present

## 2019-06-28 DIAGNOSIS — M9901 Segmental and somatic dysfunction of cervical region: Secondary | ICD-10-CM | POA: Diagnosis not present

## 2019-06-28 DIAGNOSIS — S134XXA Sprain of ligaments of cervical spine, initial encounter: Secondary | ICD-10-CM | POA: Diagnosis not present

## 2019-06-30 DIAGNOSIS — S134XXA Sprain of ligaments of cervical spine, initial encounter: Secondary | ICD-10-CM | POA: Diagnosis not present

## 2019-06-30 DIAGNOSIS — M9901 Segmental and somatic dysfunction of cervical region: Secondary | ICD-10-CM | POA: Diagnosis not present

## 2019-06-30 DIAGNOSIS — S233XXA Sprain of ligaments of thoracic spine, initial encounter: Secondary | ICD-10-CM | POA: Diagnosis not present

## 2019-07-03 DIAGNOSIS — S233XXA Sprain of ligaments of thoracic spine, initial encounter: Secondary | ICD-10-CM | POA: Diagnosis not present

## 2019-07-03 DIAGNOSIS — S134XXA Sprain of ligaments of cervical spine, initial encounter: Secondary | ICD-10-CM | POA: Diagnosis not present

## 2019-07-03 DIAGNOSIS — M9901 Segmental and somatic dysfunction of cervical region: Secondary | ICD-10-CM | POA: Diagnosis not present

## 2019-07-07 DIAGNOSIS — S233XXA Sprain of ligaments of thoracic spine, initial encounter: Secondary | ICD-10-CM | POA: Diagnosis not present

## 2019-07-07 DIAGNOSIS — S134XXA Sprain of ligaments of cervical spine, initial encounter: Secondary | ICD-10-CM | POA: Diagnosis not present

## 2019-07-07 DIAGNOSIS — M9901 Segmental and somatic dysfunction of cervical region: Secondary | ICD-10-CM | POA: Diagnosis not present

## 2019-07-10 DIAGNOSIS — M9901 Segmental and somatic dysfunction of cervical region: Secondary | ICD-10-CM | POA: Diagnosis not present

## 2019-07-10 DIAGNOSIS — S134XXA Sprain of ligaments of cervical spine, initial encounter: Secondary | ICD-10-CM | POA: Diagnosis not present

## 2019-07-10 DIAGNOSIS — S233XXA Sprain of ligaments of thoracic spine, initial encounter: Secondary | ICD-10-CM | POA: Diagnosis not present

## 2019-07-12 DIAGNOSIS — R69 Illness, unspecified: Secondary | ICD-10-CM | POA: Diagnosis not present

## 2019-07-14 DIAGNOSIS — M9901 Segmental and somatic dysfunction of cervical region: Secondary | ICD-10-CM | POA: Diagnosis not present

## 2019-07-14 DIAGNOSIS — S134XXA Sprain of ligaments of cervical spine, initial encounter: Secondary | ICD-10-CM | POA: Diagnosis not present

## 2019-07-14 DIAGNOSIS — S233XXA Sprain of ligaments of thoracic spine, initial encounter: Secondary | ICD-10-CM | POA: Diagnosis not present

## 2019-07-18 DIAGNOSIS — M9901 Segmental and somatic dysfunction of cervical region: Secondary | ICD-10-CM | POA: Diagnosis not present

## 2019-07-18 DIAGNOSIS — S134XXA Sprain of ligaments of cervical spine, initial encounter: Secondary | ICD-10-CM | POA: Diagnosis not present

## 2019-07-18 DIAGNOSIS — S233XXA Sprain of ligaments of thoracic spine, initial encounter: Secondary | ICD-10-CM | POA: Diagnosis not present

## 2019-07-25 ENCOUNTER — Telehealth: Payer: Self-pay | Admitting: Adult Health

## 2019-07-25 NOTE — Telephone Encounter (Signed)

## 2019-07-26 ENCOUNTER — Other Ambulatory Visit: Payer: Self-pay

## 2019-07-26 ENCOUNTER — Ambulatory Visit (INDEPENDENT_AMBULATORY_CARE_PROVIDER_SITE_OTHER): Payer: Medicare HMO | Admitting: Adult Health

## 2019-07-26 ENCOUNTER — Encounter: Payer: Self-pay | Admitting: Adult Health

## 2019-07-26 ENCOUNTER — Other Ambulatory Visit (HOSPITAL_COMMUNITY)
Admission: RE | Admit: 2019-07-26 | Discharge: 2019-07-26 | Disposition: A | Discharge status: Home / Self Care | Payer: Medicare HMO | Source: Ambulatory Visit | Attending: Adult Health | Admitting: Adult Health

## 2019-07-26 VITALS — BP 116/72 | Ht 64.0 in | Wt 206.0 lb

## 2019-07-26 DIAGNOSIS — Z01419 Encounter for gynecological examination (general) (routine) without abnormal findings: Secondary | ICD-10-CM | POA: Diagnosis not present

## 2019-07-26 DIAGNOSIS — R87810 Cervical high risk human papillomavirus (HPV) DNA test positive: Secondary | ICD-10-CM

## 2019-07-26 DIAGNOSIS — Z1211 Encounter for screening for malignant neoplasm of colon: Secondary | ICD-10-CM | POA: Diagnosis not present

## 2019-07-26 DIAGNOSIS — Z1212 Encounter for screening for malignant neoplasm of rectum: Secondary | ICD-10-CM

## 2019-07-26 LAB — HEMOCCULT GUIAC POC 1CARD (OFFICE): Fecal Occult Blood, POC: NEGATIVE

## 2019-07-26 NOTE — Progress Notes (Signed)
  Subjective:     Patient ID: [name], female   DOB: 02/18/1951, 68 y.o.   MRN: [id number]  HPI [name] is a 68 year old white female, married, PM, in for a pelvic and pap, she pap was +HPV 05/24/18. PCP is Dr Luan Pulling   Review of Systems Not currently sexually active Denies any pain in pelvic area  Has pain in hands, dx with OA  Reviewed past medical,surgical, social and family history. Reviewed medications and allergies.     Objective:   Physical Exam BP 116/72 (BP Location: Left Arm, Patient Position: Sitting, Cuff Size: Large)   Ht 5\' 4"  (1.626 m)   Wt 206 lb (93.4 kg)   BMI 35.36 kg/m   Skin warm and dry.Pelvic: external genitalia is normal in appearance no lesions, vagina: pale with loss of moisture and rugae,urethra has no lesions or masses noted, cervix:smooth, pap with high risk HPV with 16/18 genotyping performed, uterus: normal size, shape and contour, non tender, no masses felt, adnexa: no masses or tenderness noted. Bladder is non tender and no masses felt.  Rectal exam showed good tone, no hemorrhoids, and hemoccult was negative. Exam performed by Weyman Croon, FNP student.  Fall risk is low PHQ 9 score is 1.Examination chaperoned and supervised by me.     Assessment:     1. Encounter for gynecological examination with Papanicolaou smear of cervix   2. Screening for colorectal cancer   3. Papanicolaou smear of cervix with positive high risk human papilloma virus (HPV) test       Plan:     Pap sent Pap in 3 years if norma; Physical with PCP Labs with PCP Mammogram yearly  Try tart cherry juice for hands and warm water exercises of hands in morning.

## 2019-08-04 LAB — CYTOLOGY - PAP
Comment: NEGATIVE
Comment: NEGATIVE
Comment: NEGATIVE
Diagnosis: NEGATIVE
HPV 16: POSITIVE — AB
HPV 18 / 45: NEGATIVE
High risk HPV: POSITIVE — AB

## 2019-08-07 ENCOUNTER — Telehealth: Payer: Self-pay | Admitting: Adult Health

## 2019-08-07 NOTE — Telephone Encounter (Signed)
Left message that pap was negative for malignancy but +HPV #16 needs colpo. Per ASCCP CIN 3 risk is 15.63% per ASCCP

## 2019-08-14 ENCOUNTER — Telehealth: Payer: Self-pay | Admitting: Obstetrics & Gynecology

## 2019-08-14 NOTE — Telephone Encounter (Signed)

## 2019-08-15 ENCOUNTER — Other Ambulatory Visit: Payer: Self-pay

## 2019-08-15 ENCOUNTER — Encounter: Payer: Self-pay | Admitting: Obstetrics & Gynecology

## 2019-08-15 ENCOUNTER — Ambulatory Visit (INDEPENDENT_AMBULATORY_CARE_PROVIDER_SITE_OTHER): Payer: Medicare HMO | Admitting: Obstetrics & Gynecology

## 2019-08-15 VITALS — BP 152/95 | HR 91 | Ht 64.0 in | Wt 202.0 lb

## 2019-08-15 DIAGNOSIS — R69 Illness, unspecified: Secondary | ICD-10-CM | POA: Diagnosis not present

## 2019-08-15 DIAGNOSIS — R87810 Cervical high risk human papillomavirus (HPV) DNA test positive: Secondary | ICD-10-CM

## 2019-08-15 NOTE — Progress Notes (Signed)
Colposcopy Procedure Note:  Colposcopy Procedure Note  Indications:  Pap smear 2020:  Negative cytology +HPV 16 Pap 05/2018:negative cytology with +HPV high risk untyped  Smoker:  No. New sexual partner:  No.    History of abnormal Pap: yes  Procedure Details  The risks and benefits of the procedure and Written informed consent obtained.  Speculum placed in vagina and excellent visualization of cervix achieved, cervix swabbed x 3 with acetic acid solution.  Findings: Cervix: no visible lesions, no mosaicism, no punctation and no abnormal vasculature; SCJ visualized 360 degrees without lesions and no biopsies taken. Vaginal inspection: normal without visible lesions. Vulvar colposcopy: vulvar colposcopy not performed.  Specimens: none  Complications: none.  Plan: Repeat HPV based cytology 1 year and if still +HPV 16 do 1 more colposcopy thereafter probably no basis to continue to do them going forward

## 2019-09-14 DIAGNOSIS — E669 Obesity, unspecified: Secondary | ICD-10-CM | POA: Diagnosis not present

## 2019-09-14 DIAGNOSIS — M545 Low back pain: Secondary | ICD-10-CM | POA: Diagnosis not present

## 2019-09-14 DIAGNOSIS — I10 Essential (primary) hypertension: Secondary | ICD-10-CM | POA: Diagnosis not present

## 2019-09-14 DIAGNOSIS — R69 Illness, unspecified: Secondary | ICD-10-CM | POA: Diagnosis not present

## 2019-09-14 DIAGNOSIS — E039 Hypothyroidism, unspecified: Secondary | ICD-10-CM | POA: Diagnosis not present

## 2019-09-14 DIAGNOSIS — E119 Type 2 diabetes mellitus without complications: Secondary | ICD-10-CM | POA: Diagnosis not present

## 2019-09-14 DIAGNOSIS — E785 Hyperlipidemia, unspecified: Secondary | ICD-10-CM | POA: Diagnosis not present

## 2019-09-20 DIAGNOSIS — E039 Hypothyroidism, unspecified: Secondary | ICD-10-CM | POA: Diagnosis not present

## 2019-09-20 DIAGNOSIS — R69 Illness, unspecified: Secondary | ICD-10-CM | POA: Diagnosis not present

## 2019-09-20 DIAGNOSIS — I10 Essential (primary) hypertension: Secondary | ICD-10-CM | POA: Diagnosis not present

## 2019-09-20 DIAGNOSIS — E119 Type 2 diabetes mellitus without complications: Secondary | ICD-10-CM | POA: Diagnosis not present

## 2019-10-24 DIAGNOSIS — E119 Type 2 diabetes mellitus without complications: Secondary | ICD-10-CM | POA: Diagnosis not present

## 2019-10-24 DIAGNOSIS — I10 Essential (primary) hypertension: Secondary | ICD-10-CM | POA: Diagnosis not present

## 2019-10-24 DIAGNOSIS — E785 Hyperlipidemia, unspecified: Secondary | ICD-10-CM | POA: Diagnosis not present

## 2019-10-24 DIAGNOSIS — E039 Hypothyroidism, unspecified: Secondary | ICD-10-CM | POA: Diagnosis not present

## 2019-10-24 DIAGNOSIS — Z6833 Body mass index (BMI) 33.0-33.9, adult: Secondary | ICD-10-CM | POA: Diagnosis not present

## 2019-10-24 DIAGNOSIS — R69 Illness, unspecified: Secondary | ICD-10-CM | POA: Diagnosis not present

## 2019-12-05 ENCOUNTER — Other Ambulatory Visit (HOSPITAL_COMMUNITY): Payer: Self-pay | Admitting: Internal Medicine

## 2019-12-05 DIAGNOSIS — Z1231 Encounter for screening mammogram for malignant neoplasm of breast: Secondary | ICD-10-CM

## 2019-12-08 ENCOUNTER — Ambulatory Visit (HOSPITAL_COMMUNITY)
Admission: RE | Admit: 2019-12-08 | Discharge: 2019-12-08 | Disposition: A | Discharge status: Home / Self Care | Payer: Medicare HMO | Source: Ambulatory Visit | Attending: Internal Medicine | Admitting: Internal Medicine

## 2019-12-08 ENCOUNTER — Other Ambulatory Visit: Payer: Self-pay

## 2019-12-08 DIAGNOSIS — Z1231 Encounter for screening mammogram for malignant neoplasm of breast: Secondary | ICD-10-CM | POA: Diagnosis not present

## 2020-05-02 ENCOUNTER — Encounter (INDEPENDENT_AMBULATORY_CARE_PROVIDER_SITE_OTHER): Payer: Self-pay | Admitting: *Deleted

## 2020-09-26 IMAGING — DX RIGHT HAND - COMPLETE 3+ VIEW
3 series · 3 of 3 positions shown · non-contrast
Comparison: None.

CLINICAL DATA: Bilateral hand pain. No known injury.

EXAM:
RIGHT HAND - COMPLETE 3+ VIEW

[hand pa]
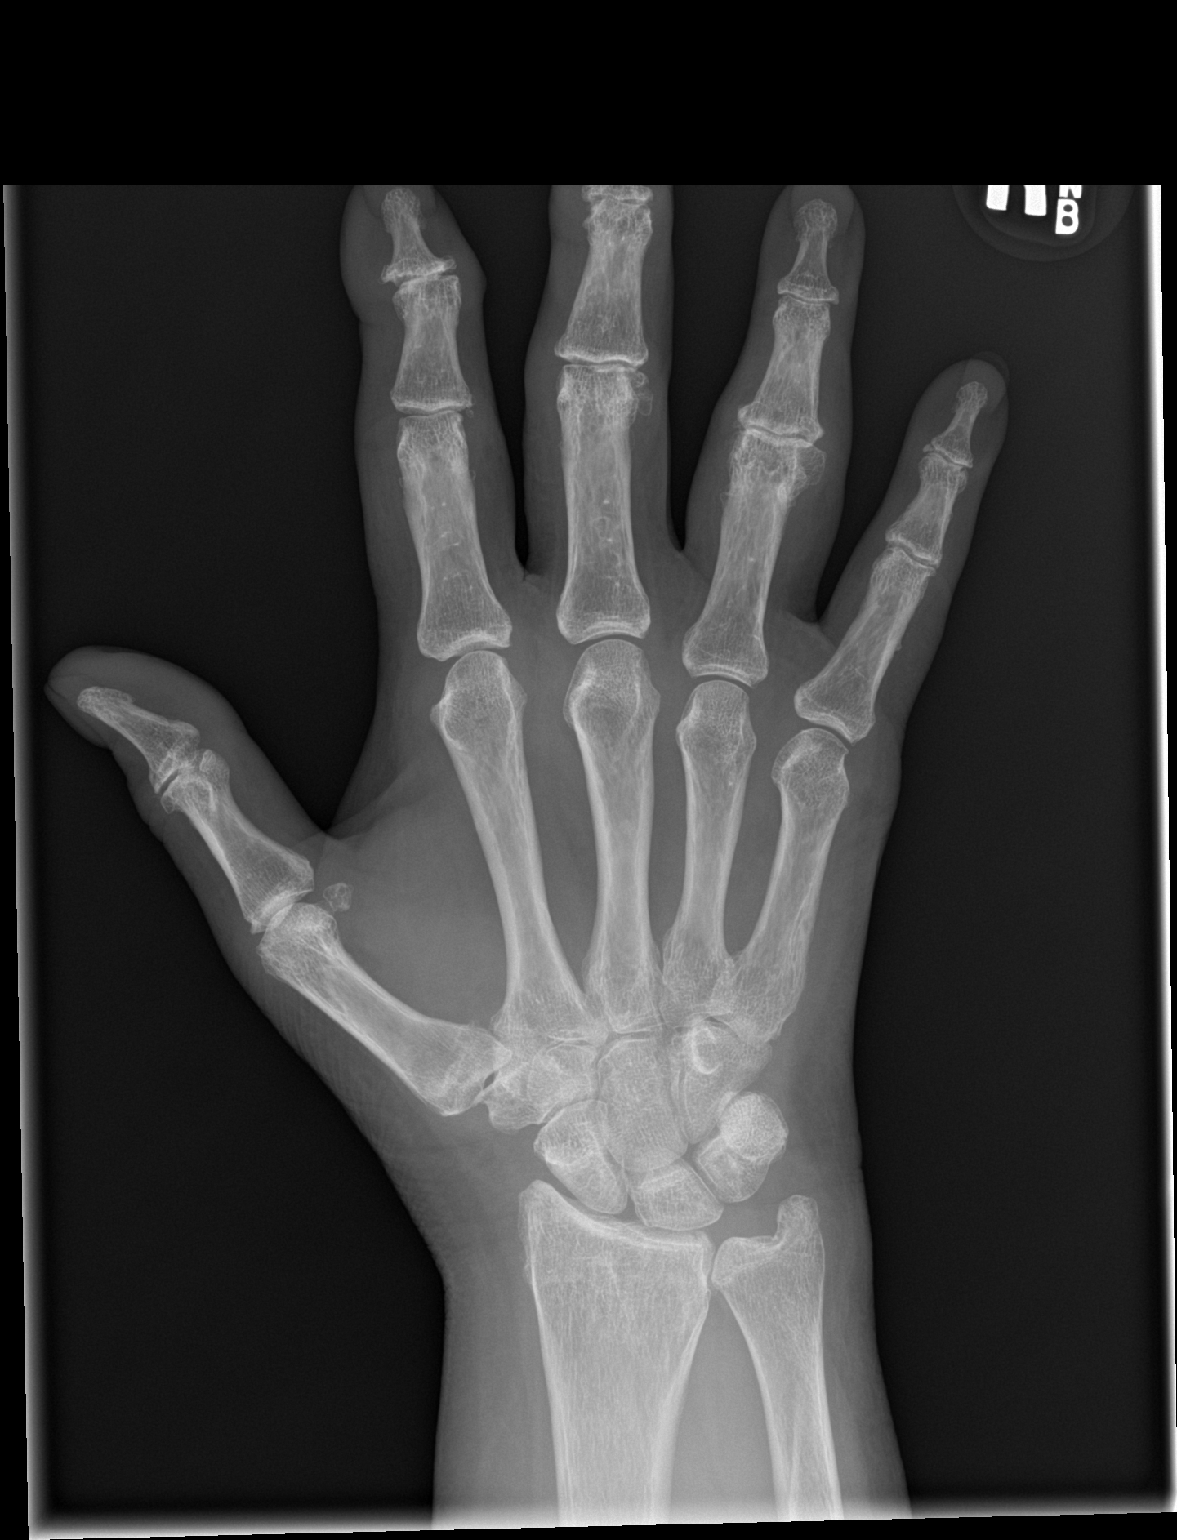

[hand obl]
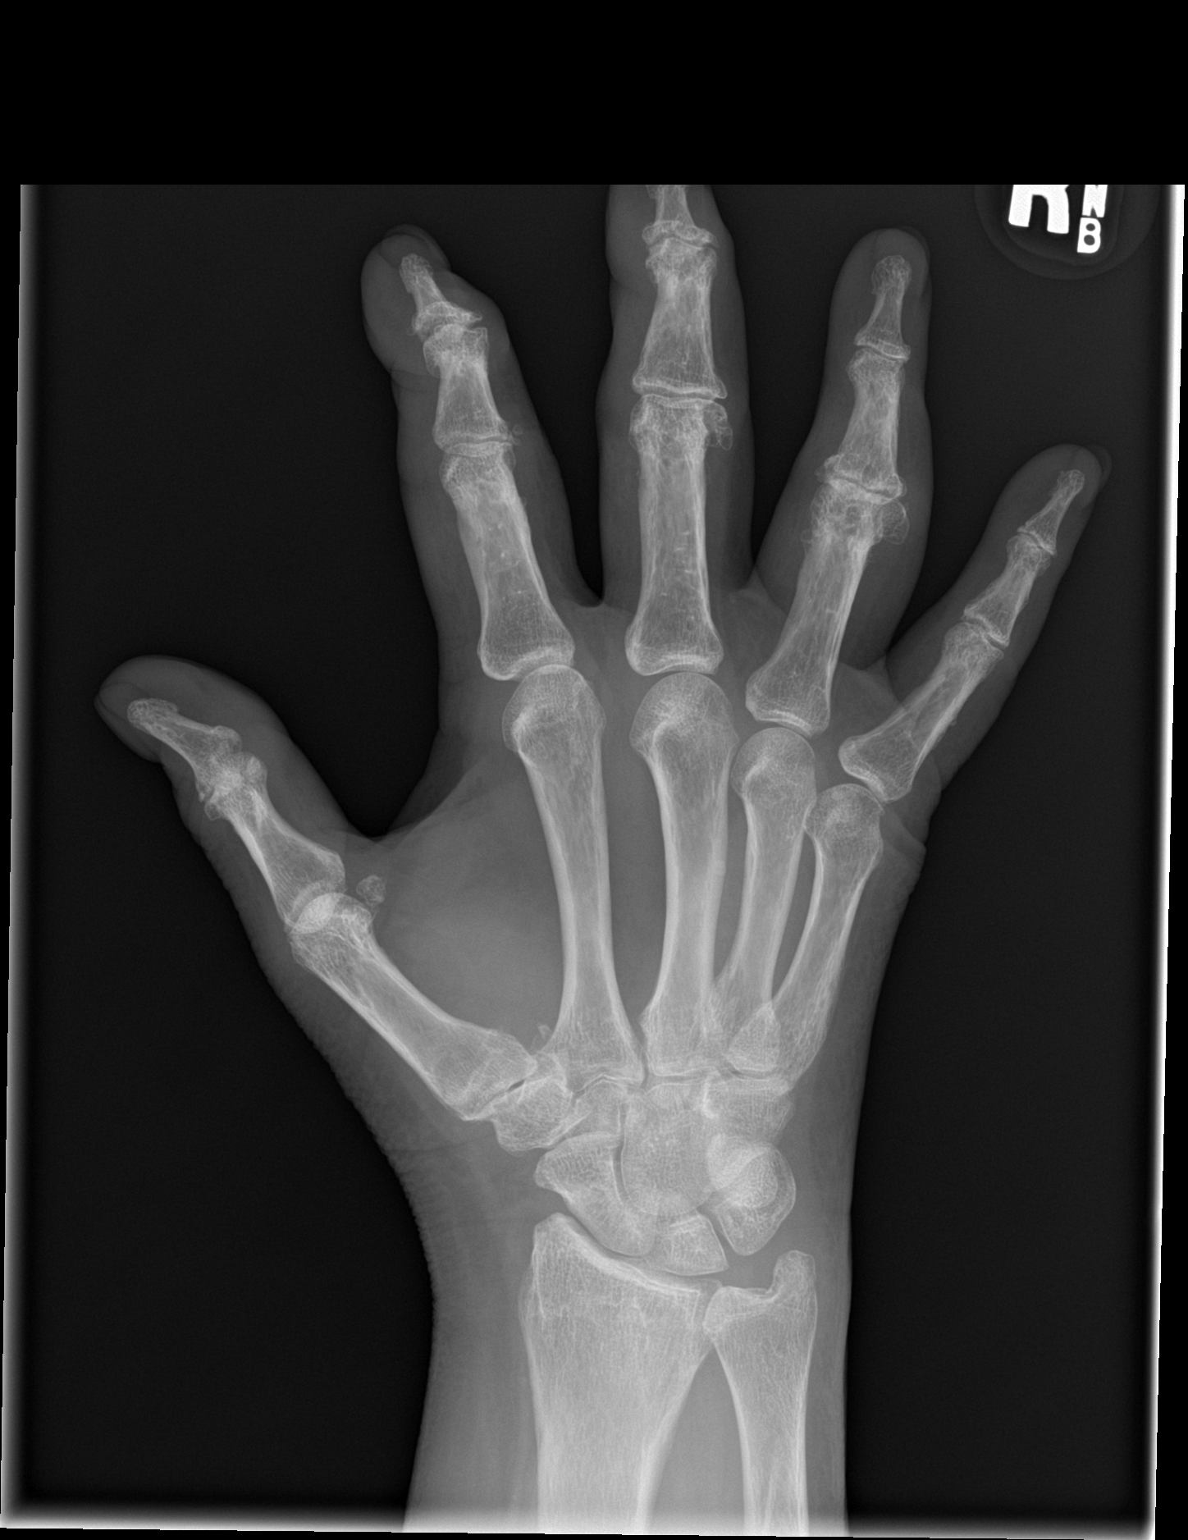

[hand lat]
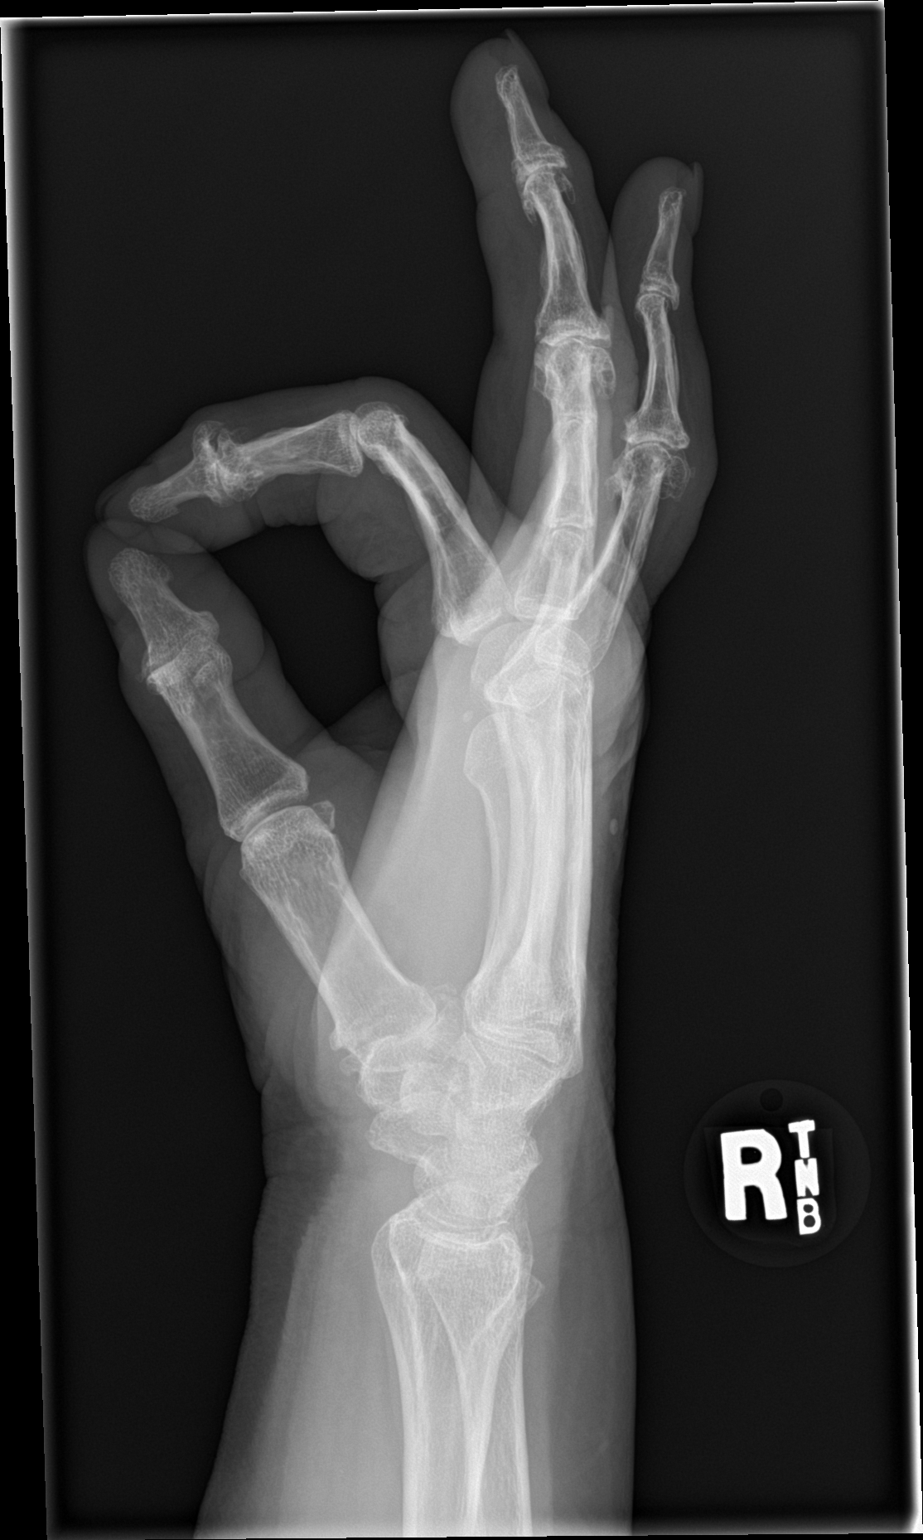

[3 of 3 positions shown; findings below may reference images not displayed]

FINDINGS: Degenerative osteoarthritis at the second and third DIP joints, at
least moderate in degree, with similar degree of degenerative change
at the second through fourth PIP joints. Milder degenerative changes
at the fifth PIP joints as well as at the fourth and fifth DIP
joints. CMC and MCP joints appear relatively well preserved. Mild
degenerative narrowing at the radiocarpal joint space.

No acute appearing osseous abnormality soft tissues about the RIGHT
hand are unremarkable.
IMPRESSION: 1. Degenerative osteoarthritis, as detailed above.
2. No acute findings.

## 2020-09-26 IMAGING — DX LEFT HAND - COMPLETE 3+ VIEW
3 series · 3 of 3 positions shown · non-contrast
Comparison: None.

CLINICAL DATA: Bilateral hand pain. No known injury.

EXAM:
LEFT HAND - COMPLETE 3+ VIEW

[hand pa]
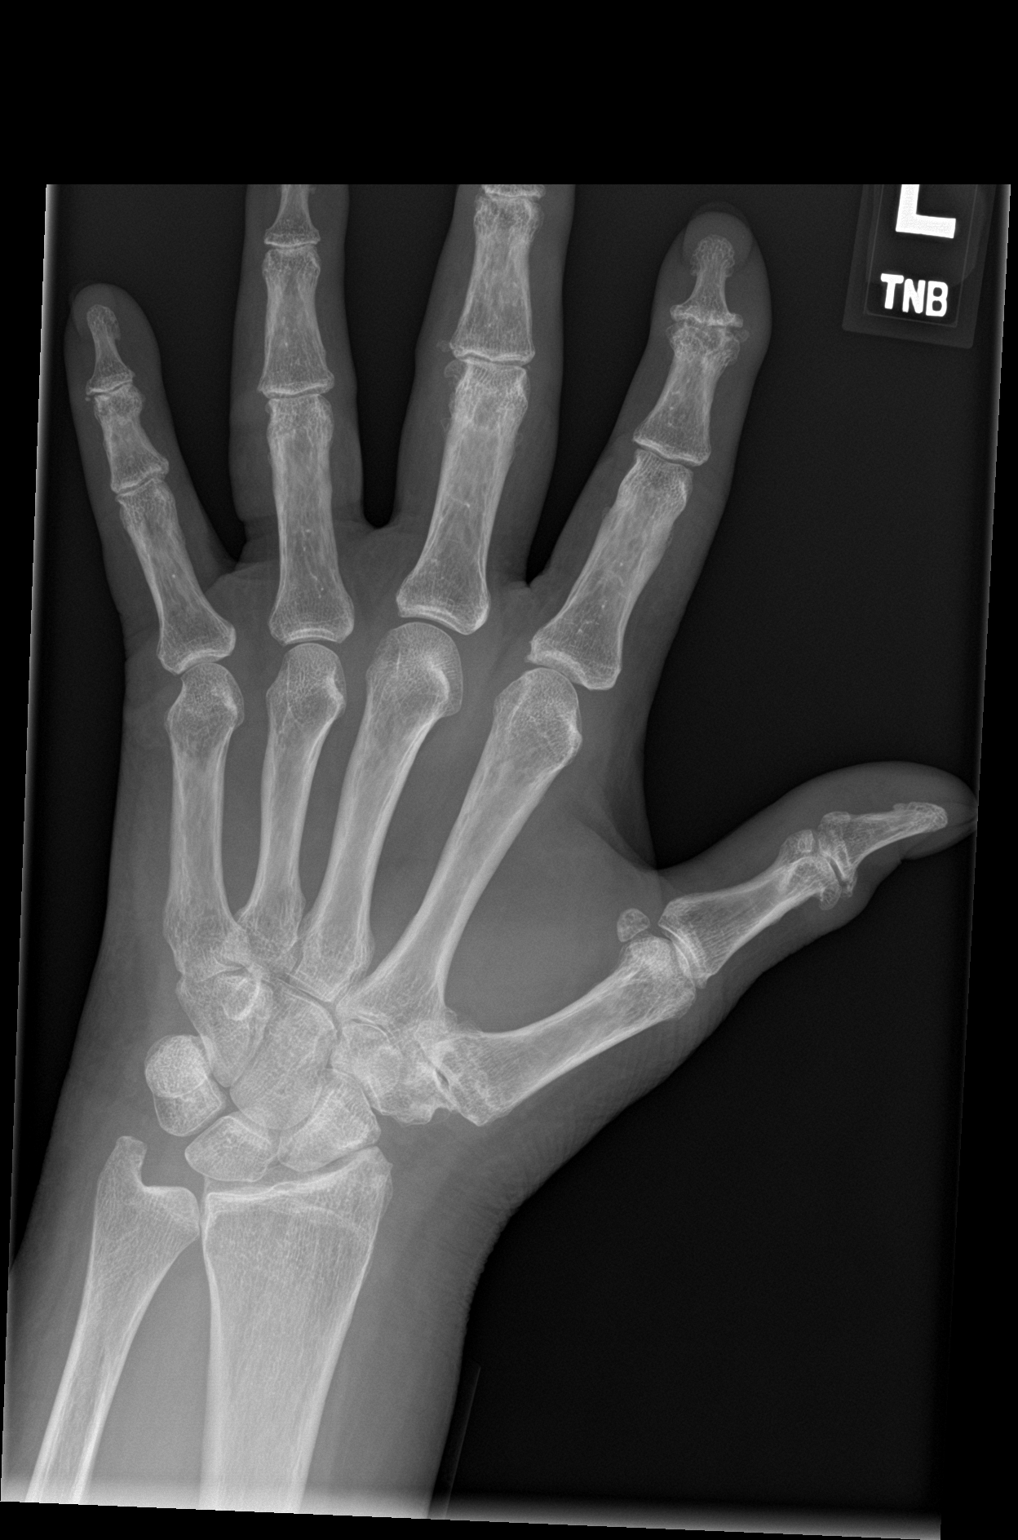

[hand obl]
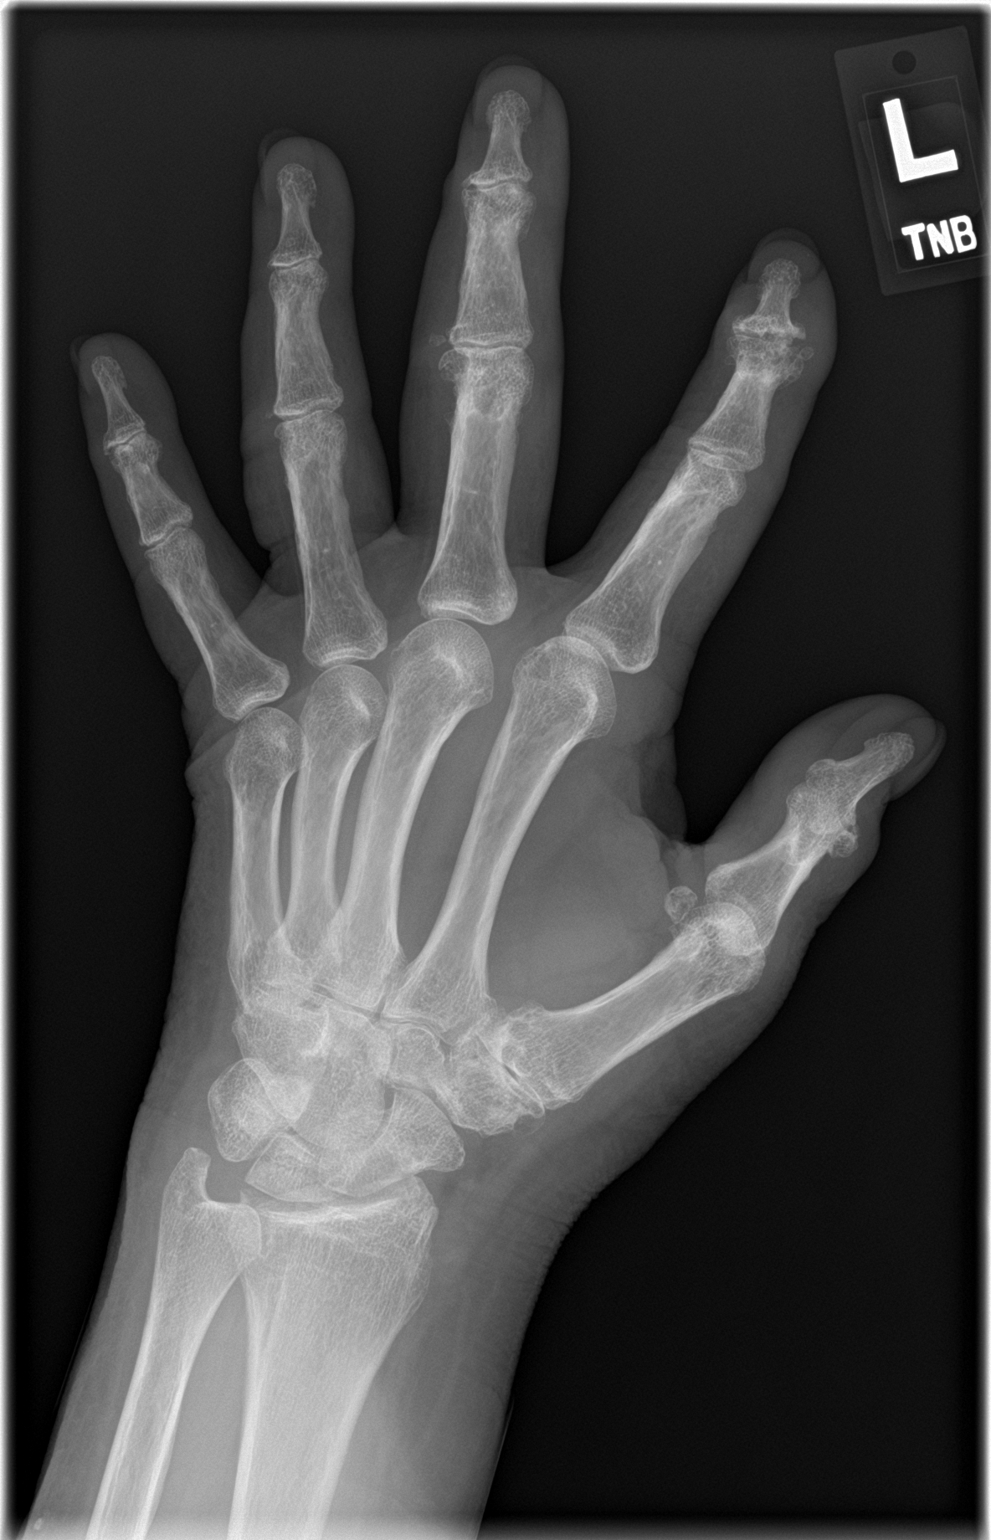

[hand lat]
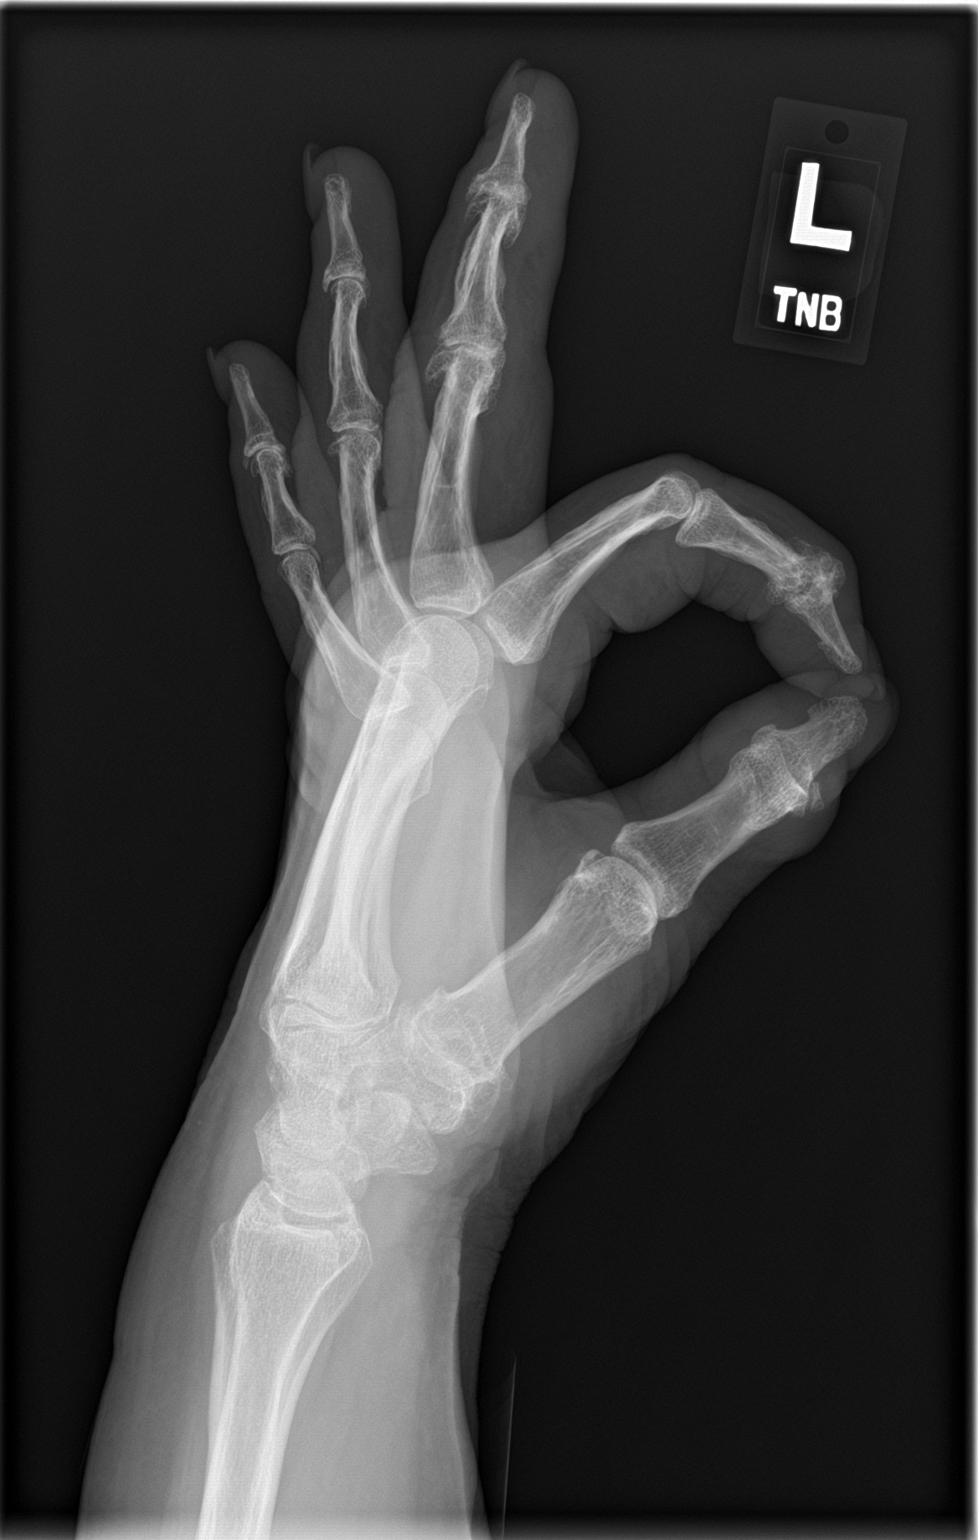

[3 of 3 positions shown; findings below may reference images not displayed]

FINDINGS: Degenerative osteoarthritis at the first and second DIP joints, at
least moderate in degree. Milder degenerative osteoarthritis at the
fourth and fifth DIP joints and at the third through fifth PIP
joints. MCP joints are relatively well preserved.

Moderate degenerative osteoarthritis also noted at the first CMC
joint with associated joint space narrowing and osseous spurring.
Remainder of the CMC joints appear well preserved. Probable mild
degenerative narrowing at the radiocarpal joint space.

No acute appearing osseous abnormality. Soft tissues about the LEFT
hand are unremarkable.
IMPRESSION: 1. Degenerative osteoarthritis, as detailed above.
2. No acute findings.

## 2021-01-29 ENCOUNTER — Other Ambulatory Visit (HOSPITAL_COMMUNITY): Payer: Self-pay | Admitting: Internal Medicine

## 2021-01-29 DIAGNOSIS — Z1231 Encounter for screening mammogram for malignant neoplasm of breast: Secondary | ICD-10-CM

## 2021-02-14 ENCOUNTER — Encounter (HOSPITAL_COMMUNITY): Payer: Self-pay

## 2021-02-14 ENCOUNTER — Inpatient Hospital Stay (HOSPITAL_COMMUNITY): Admission: RE | Admit: 2021-02-14 | Payer: Medicare HMO | Source: Ambulatory Visit

## 2022-01-06 ENCOUNTER — Other Ambulatory Visit: Payer: Self-pay

## 2022-01-06 ENCOUNTER — Encounter: Payer: Self-pay | Admitting: Adult Health

## 2022-01-06 ENCOUNTER — Ambulatory Visit (INDEPENDENT_AMBULATORY_CARE_PROVIDER_SITE_OTHER): Payer: Medicare HMO | Admitting: Adult Health

## 2022-01-06 ENCOUNTER — Other Ambulatory Visit (HOSPITAL_COMMUNITY)
Admission: RE | Admit: 2022-01-06 | Discharge: 2022-01-06 | Disposition: A | Discharge status: Home / Self Care | Payer: Medicare HMO | Source: Ambulatory Visit | Attending: Adult Health | Admitting: Adult Health

## 2022-01-06 VITALS — BP 123/82 | HR 75 | Ht 65.0 in | Wt 192.5 lb

## 2022-01-06 DIAGNOSIS — Z01419 Encounter for gynecological examination (general) (routine) without abnormal findings: Secondary | ICD-10-CM

## 2022-01-06 DIAGNOSIS — Z1151 Encounter for screening for human papillomavirus (HPV): Secondary | ICD-10-CM | POA: Diagnosis not present

## 2022-01-06 DIAGNOSIS — R87810 Cervical high risk human papillomavirus (HPV) DNA test positive: Secondary | ICD-10-CM

## 2022-01-06 DIAGNOSIS — Z1211 Encounter for screening for malignant neoplasm of colon: Secondary | ICD-10-CM

## 2022-01-06 DIAGNOSIS — Z0000 Encounter for general adult medical examination without abnormal findings: Secondary | ICD-10-CM

## 2022-01-06 LAB — HEMOCCULT GUIAC POC 1CARD (OFFICE): Fecal Occult Blood, POC: NEGATIVE

## 2022-01-06 NOTE — Progress Notes (Signed)
Patient ID: [name], female   DOB: February 03, 1951, 71 y.o.   MRN: [id number] ?History of Present Illness: ?[name] is a 71 year old white female,married, PM in for a well woman gyn exam and pap.  ?Her last pap was 07/26/2019, negative malignancy +HPV16/other and she had colpo 08/15/2019 with Dr Elonda Husky with no visible lesions. She was due back in 1 year, but did not get here. ?PCP is Dr Sherrie Sport ? ? ?Current Medications, Allergies, Past Medical History, Past Surgical History, Family History and Social History were reviewed in [employer] record.   ? ? ?Review of Systems: ?Patient denies any headaches, hearing loss, fatigue, blurred vision, shortness of breath, chest pain, abdominal pain, problems with bowel movements, urination, or intercourse.(Not active). No joint pain or mood swings.  ?Denies any vaginal bleeding ? ? ?Physical Exam:BP 123/82 (BP Location: Left Arm, Patient Position: Sitting, Cuff Size: Normal)   Pulse 75   Ht '5\' 5"'$  (1.651 m)   Wt 192 lb 8 oz (87.3 kg)   BMI 32.03 kg/m?   ?General:  Well developed, well nourished, no acute distress ?Skin:  Warm and dry ?Neck:  Midline trachea, normal thyroid, good ROM, no lymphadenopathy,no carotid bruits heard  ?Lungs; Clear to auscultation bilaterally ?Breast:  No dominant palpable mass, retraction, or nipple discharge ?Cardiovascular: Regular rate and rhythm ?Abdomen:  Soft, non tender, no hepatosplenomegaly ?Pelvic:  External genitalia is normal in appearance, no lesions.  The vagina is pale with loss of rugae.Marland Kitchen Urethra has no lesions or masses. The cervix is smooth, pap with HR HPV genotyping performed.  Uterus is felt to be normal size, shape, and contour.  No adnexal masses or tenderness noted.Bladder is non tender, no masses felt. ?Rectal: Good sphincter tone, no polyps, or hemorrhoids felt.  Hemoccult negative. ?Extremities/musculoskeletal:  No swelling or varicosities noted, no clubbing or cyanosis ?Psych:  No mood changes,  alert and cooperative,seems happy ?AA is 0 ?Fall risk is low ? ?  01/06/2022  ?  2:28 PM 07/26/2019  ?  2:10 PM 05/18/2018  ?  1:43 PM  ?Depression screen PHQ 2/9  ?Decreased Interest 1 0 0  ?Down, Depressed, Hopeless 1 0 1  ?PHQ - 2 Score 2 0 1  ?Altered sleeping 1 0 1  ?Tired, decreased energy 0 0 1  ?Change in appetite 0 0   ?Feeling bad or failure about yourself  1 0 1  ?Trouble concentrating 0 1 1  ?Moving slowly or fidgety/restless 0 0 0  ?Suicidal thoughts 0 0 0  ?PHQ-9 Score '4 1 5  '$ ?Difficult doing work/chores  Somewhat difficult Not difficult at all  ? She is on meds ? ?  01/06/2022  ?  2:28 PM  ?GAD 7 : Generalized Anxiety Score  ?Nervous, Anxious, on Edge 1  ?Control/stop worrying 0  ?Worry too much - different things 1  ?Trouble relaxing 1  ?Restless 0  ?Easily annoyed or irritable 1  ?Afraid - awful might happen 1  ?Total GAD 7 Score 5  ? ?  ? Upstream - 01/06/22 1426   ? ?  ? Pregnancy Intention Screening  ? Does the patient want to become pregnant in the next year? No   ? Does the patient's partner want to become pregnant in the next year? No   ? Would the patient like to discuss contraceptive options today? No   ?  ? Contraception Wrap Up  ? Current Method No Method - Other Reason   postmenopausal  ?  End Method No Method - Other Reason   postmenopausal  ? Contraception Counseling Provided No   ? ?  ?  ? ?  ? Examination chaperoned by Levy Pupa LPN ? ? ?Impression and Plan: ?1. Encounter for gynecological examination with Papanicolaou smear of cervix ?Pap sent with HR HPV genotyping, if negative no more paps, ?Physical with PCP ?Labs with PCP ?Get mammogram ?Colonoscopy per GI  ? ?2. Papanicolaou smear of cervix with positive high risk human papilloma virus (HPV) test ?Pap sent, if HPV still positive, will get colpo with Dr Elonda Husky ? ?3. Encounter for screening fecal occult blood testing ?Hemoccult negative  ? ? ? ?  ?  ?

## 2022-01-13 LAB — CYTOLOGY - PAP
Comment: NEGATIVE
Comment: NEGATIVE
Comment: NEGATIVE
Diagnosis: UNDETERMINED — AB
HPV 16: POSITIVE — AB
HPV 18 / 45: NEGATIVE
High risk HPV: POSITIVE — AB

## 2022-01-15 ENCOUNTER — Telehealth: Payer: Self-pay | Admitting: Adult Health

## 2022-01-15 DIAGNOSIS — R87810 Cervical high risk human papillomavirus (HPV) DNA test positive: Secondary | ICD-10-CM

## 2022-01-15 NOTE — Telephone Encounter (Signed)
Pt aware of pap, will make appt with Dr Nelda Marseille  ?

## 2022-01-15 NOTE — Telephone Encounter (Signed)
Left message that pap ASCUS with HPV, needs colpo, please call for colpo appt  ?

## 2022-01-22 ENCOUNTER — Encounter: Payer: Medicare HMO | Admitting: Obstetrics & Gynecology

## 2022-02-09 ENCOUNTER — Encounter: Payer: Medicare HMO | Admitting: Obstetrics & Gynecology

## 2022-02-10 ENCOUNTER — Encounter: Payer: Medicare HMO | Admitting: Women's Health

## 2022-02-24 ENCOUNTER — Other Ambulatory Visit (HOSPITAL_COMMUNITY)
Admission: RE | Admit: 2022-02-24 | Discharge: 2022-02-24 | Disposition: A | Discharge status: Home / Self Care | Payer: Medicare HMO | Source: Ambulatory Visit | Attending: Obstetrics & Gynecology | Admitting: Obstetrics & Gynecology

## 2022-02-24 ENCOUNTER — Ambulatory Visit (INDEPENDENT_AMBULATORY_CARE_PROVIDER_SITE_OTHER): Payer: Medicare HMO | Admitting: Obstetrics & Gynecology

## 2022-02-24 ENCOUNTER — Encounter: Payer: Self-pay | Admitting: Obstetrics & Gynecology

## 2022-02-24 VITALS — BP 142/85 | HR 74 | Ht 64.75 in | Wt 188.4 lb

## 2022-02-24 DIAGNOSIS — R87610 Atypical squamous cells of undetermined significance on cytologic smear of cervix (ASC-US): Secondary | ICD-10-CM | POA: Diagnosis present

## 2022-02-24 DIAGNOSIS — R87810 Cervical high risk human papillomavirus (HPV) DNA test positive: Secondary | ICD-10-CM

## 2022-02-24 DIAGNOSIS — Z780 Asymptomatic menopausal state: Secondary | ICD-10-CM | POA: Diagnosis not present

## 2022-02-24 NOTE — Addendum Note (Signed)
Addended by: Linton Rump on: 02/24/2022 09:20 AM ? ? Modules accepted: Orders ? ?

## 2022-02-24 NOTE — Progress Notes (Signed)
? ? ?  Patient name: [name] MRN [id number]  Date of birth: 1950-11-20 ?Chief Complaint:   ?Colposcopy ? ?History of Present Illness:   ?[name] is a 71 y.o. G0P0000 PM female being seen today for cervical dysplasia management. ? ?Recent pap 01/06/2022: ASCUS, HPV 16 pos ? ?Smoker:  former smoker ?New sexual partner:  No.  ? ?History of abnormal Pap: yes ?07/2019- HPV 16 pos, normal pap ?2019- HPV pos ? ?Pt is postmenopausal, no bleeding noted. ? ? ?No LMP recorded. Patient is postmenopausal. ? ? ?  01/06/2022  ?  2:28 PM 07/26/2019  ?  2:10 PM 05/18/2018  ?  1:43 PM  ?Depression screen PHQ 2/9  ?Decreased Interest 1 0 0  ?Down, Depressed, Hopeless 1 0 1  ?PHQ - 2 Score 2 0 1  ?Altered sleeping 1 0 1  ?Tired, decreased energy 0 0 1  ?Change in appetite 0 0   ?Feeling bad or failure about yourself  1 0 1  ?Trouble concentrating 0 1 1  ?Moving slowly or fidgety/restless 0 0 0  ?Suicidal thoughts 0 0 0  ?PHQ-9 Score '4 1 5  '$ ?Difficult doing work/chores  Somewhat difficult Not difficult at all  ? ? ? ?Review of Systems:   ?Pertinent items are noted in HPI ?Denies fever/chills, dizziness, headaches, visual disturbances, fatigue, shortness of breath, chest pain, abdominal pain, vomiting, Denies issues  bowel movements, urination, or intercourse unless otherwise stated above.  ?Pertinent History Reviewed:  ?Reviewed past medical,surgical, social, obstetrical and family history.  ?Reviewed problem list, medications and allergies. ?Physical Assessment:  ? ?Vitals:  ? 02/24/22 0846  ?BP: (!) 142/85  ?Pulse: 74  ?Weight: 188 lb 6.4 oz (85.5 kg)  ?Height: 5' 4.75" (1.645 m)  ?Body mass index is 31.59 kg/m?. ? ?     Physical Examination:  ? General appearance: alert, well appearing, and in no distress ? Psych: mood appropriate, normal affect ? Skin: warm & dry  ? Cardiovascular: normal heart rate noted ? Respiratory: normal respiratory effort, no distress ? Abdomen: soft, non-tender  ? Pelvic: VULVA: normal  appearing vulva with no masses, tenderness or lesions, VAGINA: normal appearing vagina with normal color and discharge, no lesions, CERVIX: atrophic changes noted with cervical stenosis ? Extremities: no edema  ? ?Chaperone: Levy Pupa   ? ? ?Colposcopy Procedure Note ? ?Indications: ASCUS, HPV 16 pos ? ? ? ?Procedure Details  ?The risks and benefits of the procedure and Written informed consent obtained. ? ?Speculum placed in vagina and excellent visualization of cervix achieved, cervix swabbed x 3 with acetic acid solution. ? ?Findings: ?Adequate colposcopy is noted today.  TMZ zone seen ? ?Cervix: no visible lesions; acteowhite changes noted, ECC and cervical biopsies obtained.   ? ?Monsel's applied.  Adequate hemostasis noted ? ?Specimens: unable to obtain ECC, cervical biopsies obtained ? ?Complications: none. ? ?Colposcopic Impression: negative ? ? ?Plan(Based on 2019 ASCCP recommendations) ? ?-Discussed HPV- reviewed incidence and its potential to cause condylomas to dysplasia to cervical cancer ?-Reviewed degree of abnormal pap smears  ?-Discussed ASCCP guidelines and current recommendations for colposcopy ?-As above, inform consent obtained and procedure completed ?-biopsies obtained, further management pending results ?-Questions and concerns were addressed ? ?Janyth Pupa, DO ?Attending [place] Faculty [facility] ?[facility] ? ? ?  ?

## 2022-02-26 ENCOUNTER — Telehealth: Payer: Self-pay | Admitting: *Deleted

## 2022-02-26 LAB — SURGICAL PATHOLOGY

## 2022-02-26 NOTE — Telephone Encounter (Signed)
Left message @ 4:49 pm. JSY

## 2022-02-26 NOTE — Telephone Encounter (Signed)
-----   Message from Janyth Pupa, DO sent at 02/26/2022  1:36 PM EDT ----- Low grade CIN 1, follow up in 1 yr

## 2022-03-02 NOTE — Telephone Encounter (Signed)
Pt aware biopsy showed low grade CIN 1 and to follow up in 1 year. Advised it's not cancer but she needs to follow up.  Advised pt she should get a reminder closer to when appt is due. Pt voiced understanding. [place]

## 2022-05-13 ENCOUNTER — Encounter (INDEPENDENT_AMBULATORY_CARE_PROVIDER_SITE_OTHER): Payer: Self-pay | Admitting: *Deleted

## 2022-11-13 ENCOUNTER — Encounter (INDEPENDENT_AMBULATORY_CARE_PROVIDER_SITE_OTHER): Payer: Self-pay | Admitting: *Deleted

## 2022-11-17 ENCOUNTER — Encounter: Payer: Self-pay | Admitting: *Deleted

## 2023-01-20 ENCOUNTER — Ambulatory Visit: Payer: Medicare HMO | Admitting: Obstetrics & Gynecology

## 2023-02-11 ENCOUNTER — Other Ambulatory Visit (HOSPITAL_COMMUNITY): Payer: Self-pay | Admitting: Internal Medicine

## 2023-02-11 DIAGNOSIS — Z1231 Encounter for screening mammogram for malignant neoplasm of breast: Secondary | ICD-10-CM

## 2023-02-16 ENCOUNTER — Encounter: Payer: Self-pay | Admitting: Adult Health

## 2023-02-16 ENCOUNTER — Ambulatory Visit (INDEPENDENT_AMBULATORY_CARE_PROVIDER_SITE_OTHER): Payer: Medicare HMO | Admitting: Adult Health

## 2023-02-16 ENCOUNTER — Other Ambulatory Visit (HOSPITAL_COMMUNITY)
Admission: RE | Admit: 2023-02-16 | Discharge: 2023-02-16 | Disposition: A | Discharge status: Home / Self Care | Payer: Medicare HMO | Source: Ambulatory Visit | Attending: Obstetrics & Gynecology | Admitting: Obstetrics & Gynecology

## 2023-02-16 VITALS — BP 137/88 | HR 79 | Ht 65.5 in | Wt 187.0 lb

## 2023-02-16 DIAGNOSIS — L900 Lichen sclerosus et atrophicus: Secondary | ICD-10-CM

## 2023-02-16 DIAGNOSIS — R87610 Atypical squamous cells of undetermined significance on cytologic smear of cervix (ASC-US): Secondary | ICD-10-CM | POA: Diagnosis not present

## 2023-02-16 DIAGNOSIS — Z01419 Encounter for gynecological examination (general) (routine) without abnormal findings: Secondary | ICD-10-CM | POA: Diagnosis not present

## 2023-02-16 DIAGNOSIS — Z1151 Encounter for screening for human papillomavirus (HPV): Secondary | ICD-10-CM | POA: Diagnosis not present

## 2023-02-16 DIAGNOSIS — R87810 Cervical high risk human papillomavirus (HPV) DNA test positive: Secondary | ICD-10-CM

## 2023-02-16 DIAGNOSIS — Z780 Asymptomatic menopausal state: Secondary | ICD-10-CM | POA: Diagnosis not present

## 2023-02-16 DIAGNOSIS — Z1211 Encounter for screening for malignant neoplasm of colon: Secondary | ICD-10-CM

## 2023-02-16 LAB — HEMOCCULT GUIAC POC 1CARD (OFFICE): Fecal Occult Blood, POC: NEGATIVE

## 2023-02-16 MED ORDER — CLOBETASOL PROPIONATE 0.05 % EX OINT: TOPICAL_OINTMENT | CUTANEOUS | 3 refills | Status: DC

## 2023-02-16 NOTE — Progress Notes (Signed)
Patient ID: [name], female   DOB: Aug 18, 1951, 72 y.o.   MRN: [id number] History of Present Illness: [name] is a 72 year old white female,married, PM in for well woman gyn exam and pap. Her pap 01/06/22 was ASCUS +HPV 16 and other, and she had colpo 02/24/22 CIN 1. She has had some external itching too.   PCP is Dr Olena Leatherwood   Current Medications, Allergies, Past Medical History, Past Surgical History, Family History and Social History were reviewed in [employer] electronic medical record.     Review of Systems: Patient denies any headaches, hearing loss, fatigue, blurred vision, shortness of breath, chest pain, abdominal pain, problems with bowel movements, urination, or intercourse.(Not active). No joint pain or mood swings.  Has external itching in vulva area.  Denies any vaginal bleeding   Physical Exam:BP 137/88 (BP Location: Right Arm, Patient Position: Sitting, Cuff Size: Normal)   Pulse 79   Ht 5' 5.5" (1.664 m)   Wt 187 lb (84.8 kg)   BMI 30.65 kg/m   General:  Well developed, well nourished, no acute distress Skin:  Warm and dry Neck:  Midline trachea, normal thyroid, good ROM, no lymphadenopathy,no carotid bruits heard  Lungs; Clear to auscultation bilaterally Breast:  No dominant palpable mass, retraction, or nipple discharge Cardiovascular: Regular rate and rhythm Abdomen:  Soft, non tender, no hepatosplenomegaly Pelvic:  External genitalia is normal in appearance, has thickened white tissue about clitoral area. The vagina is pale. Urethra has no lesions or masses. The cervix is smooth, pap with HR HPV genotyping performed.  Uterus is felt to be normal size, shape, and contour.  No adnexal masses or tenderness noted.Bladder is non tender, no masses felt. Rectal: Good sphincter tone, no polyps, +hemorrhoids felt.  Hemoccult negative. Extremities/musculoskeletal:  No swelling or varicosities noted, no clubbing or cyanosis Psych:  No mood changes, alert and  cooperative,seems happy AA is 0 Fall risk is low    02/16/2023   10:43 AM 01/06/2022    2:28 PM 07/26/2019    2:10 PM  Depression screen PHQ 2/9  Decreased Interest 0 1 0  Down, Depressed, Hopeless 1 1 0  PHQ - 2 Score 1 2 0  Altered sleeping 1 1 0  Tired, decreased energy 0 0 0  Change in appetite 0 0 0  Feeling bad or failure about yourself  1 1 0  Trouble concentrating 0 0 1  Moving slowly or fidgety/restless 0 0 0  Suicidal thoughts 0 0 0  PHQ-9 Score 3 4 1   Difficult doing work/chores   Somewhat difficult       02/16/2023   10:43 AM 01/06/2022    2:28 PM  GAD 7 : Generalized Anxiety Score  Nervous, Anxious, on Edge 1 1  Control/stop worrying 1 0  Worry too much - different things 1 1  Trouble relaxing 0 1  Restless 0 0  Easily annoyed or irritable 1 1  Afraid - awful might happen 0 1  Total GAD 7 Score 4 5      Upstream - 02/16/23 1051       Pregnancy Intention Screening   Does the patient want to become pregnant in the next year? N/A    Does the patient's partner want to become pregnant in the next year? N/A    Would the patient like to discuss contraceptive options today? N/A      Contraception Wrap Up   Current Method Abstinence    End Method Abstinence  Contraception Counseling Provided No             Examination chaperoned by Malachy Mood LPN  Impression and Plan: 1. Encounter for gynecological examination with Papanicolaou smear of cervix Pap sent  Physical in 2 years - Cytology - PAP( [facility]) Labs with PCP Get mammogram  Colonoscopy per GI  2. ASCUS with positive high risk HPV cervical Pap sent Discussed should clear HPV virus Not sure how she could have had  3. Encounter for screening fecal occult blood testing Hemoccult was negative  - POCT occult blood stool  4. Postmenopausal  5. Lichen sclerosus Has had itching in vulva area Has white thickened tissue about clitoral area Will rx temovate Meds ordered this encounter   Medications   clobetasol ointment (TEMOVATE) 0.05 %    Sig: Use 2-3 weekly to external affected tissue    Dispense:  45 g    Refill:  3    Order Specific Question:   Supervising Provider    Answer:   Lazaro Arms [[id number]]   Will recheck in 4 months

## 2023-02-17 ENCOUNTER — Other Ambulatory Visit: Payer: Self-pay | Admitting: Adult Health

## 2023-02-17 MED ORDER — BETAMETHASONE DIPROPIONATE 0.05 % EX CREA: TOPICAL_CREAM | Freq: Two times a day (BID) | CUTANEOUS | 3 refills | Status: DC

## 2023-02-17 NOTE — Progress Notes (Signed)
Will rx betamethasone dipropionate 0.05% cream, temovate not covered

## 2023-02-19 LAB — CYTOLOGY - PAP
Comment: NEGATIVE
Comment: NEGATIVE
Comment: NEGATIVE
HPV 16: POSITIVE — AB
HPV 18 / 45: NEGATIVE
High risk HPV: POSITIVE — AB

## 2023-02-22 ENCOUNTER — Encounter: Payer: Self-pay | Admitting: Adult Health

## 2023-02-22 DIAGNOSIS — R87612 Low grade squamous intraepithelial lesion on cytologic smear of cervix (LGSIL): Secondary | ICD-10-CM | POA: Insufficient documentation

## 2023-02-26 ENCOUNTER — Telehealth: Payer: Self-pay | Admitting: Adult Health

## 2023-02-26 NOTE — Telephone Encounter (Signed)
Pt aware of pap will get colpo with Dr Despina Hidden

## 2023-03-25 ENCOUNTER — Encounter: Payer: Self-pay | Admitting: Obstetrics & Gynecology

## 2023-03-25 ENCOUNTER — Ambulatory Visit (INDEPENDENT_AMBULATORY_CARE_PROVIDER_SITE_OTHER): Payer: Medicare HMO | Admitting: Obstetrics & Gynecology

## 2023-03-25 VITALS — BP 142/89 | HR 75 | Ht 65.0 in

## 2023-03-25 DIAGNOSIS — N870 Mild cervical dysplasia: Secondary | ICD-10-CM

## 2023-03-25 DIAGNOSIS — R87810 Cervical high risk human papillomavirus (HPV) DNA test positive: Secondary | ICD-10-CM | POA: Diagnosis not present

## 2023-03-25 MED ORDER — METRONIDAZOLE 0.75 % VA GEL: 1.0000 | Freq: Every day | VAGINAL | 5 refills | Status: DC

## 2023-03-25 NOTE — Progress Notes (Signed)
    Colposcopy Procedure Note:    Colposcopy Procedure Note  Indications:  2024  LSIL + HPV 16-->normal colposcopy   2019 ASCCP recommendation:  Smoker:  No.none New sexual partner:  No.    History of abnormal Pap: yes  Procedure Details  The risks and benefits of the procedure and Written informed consent obtained.  Speculum placed in vagina and excellent visualization of cervix achieved, cervix swabbed x 3 with acetic acid solution.  Findings: Adequate colposcopy is noted today.  Cervix: no visible lesions, no mosaicism, no punctation, and no abnormal vasculature; SCJ visualized 360 degrees without lesions and no biopsies taken. Vaginal inspection: normal without visible lesions. Vulvar colposcopy: vulvar colposcopy not performed.  Specimens: none  Complications: none.  Colposcopic Impression: Normal colposocpy  Plan(Based on 2019 ASCCP recommendations) No further surveillance is required

## 2023-05-18 ENCOUNTER — Encounter: Payer: Self-pay | Admitting: *Deleted

## 2023-06-21 ENCOUNTER — Encounter: Payer: Self-pay | Admitting: Adult Health

## 2023-06-21 ENCOUNTER — Ambulatory Visit: Payer: Medicare HMO | Admitting: Adult Health

## 2023-06-21 VITALS — BP 125/79 | HR 63 | Ht 65.0 in | Wt 184.5 lb

## 2023-06-21 DIAGNOSIS — L900 Lichen sclerosus et atrophicus: Secondary | ICD-10-CM

## 2023-06-21 MED ORDER — BETAMETHASONE DIPROPIONATE 0.05 % EX CREA: TOPICAL_CREAM | Freq: Two times a day (BID) | CUTANEOUS | 3 refills | Status: AC

## 2023-06-21 NOTE — Progress Notes (Signed)
  Subjective:     Patient ID: [name], female   DOB: 1951-04-28, 72 y.o.   MRN: [id number]  HPI [name] is a 72 year old white female,married, PM in for 4 month recheck on lichen sclerosus, no itching or burning and not using cream, she forgot about it.      Component Value Date/Time   DIAGPAP - Low grade squamous intraepithelial lesion (LSIL) (A) 02/16/2023 1051   DIAGPAP (A) 01/06/2022 1432    - Atypical squamous cells of undetermined significance (ASC-US)   DIAGPAP  07/26/2019 1505    - Negative for intraepithelial lesion or malignancy (NILM)   HPVHIGH Positive (A) 02/16/2023 1051   HPVHIGH Positive (A) 01/06/2022 1432   HPVHIGH Positive (A) 07/26/2019 1505   ADEQPAP  02/16/2023 1051    Satisfactory for evaluation. The presence or absence of an   ADEQPAP  02/16/2023 1051    endocervical/transformation zone component cannot be determined because   ADEQPAP of atrophy. 02/16/2023 1051    PCP is Dr Olena Leatherwood  Review of Systems Denies any itching or burning Not having sex  Reviewed past medical,surgical, social and family history. Reviewed medications and allergies.     Objective:   Physical Exam BP 125/79 (BP Location: Left Arm, Patient Position: Sitting, Cuff Size: Normal)   Pulse 63   Ht 5\' 5"  (1.651 m)   Wt 184 lb 8 oz (83.7 kg)   BMI 30.70 kg/m     Skin warm and dry.Pelvic: external genitalia is normal in appearance, has white skin thickened skin at clitoris area and white thin skin at base left vulva, with crack and same at introitus Fall risk is low  Upstream - 06/21/23 0935       Pregnancy Intention Screening   Does the patient want to become pregnant in the next year? N/A    Does the patient's partner want to become pregnant in the next year? N/A    Would the patient like to discuss contraceptive options today? N/A      Contraception Wrap Up   Current Method Abstinence   PM   End Method Abstinence   PM   Contraception Counseling Provided No             Examination chaperoned by Malachy Mood LPN  Assessment:     1. Lichen sclerosus Still has white skin and some cracks in skin, no itching or burning, hs not been using cream Will refill betamethasone cream  Meds ordered this encounter  Medications   betamethasone dipropionate 0.05 % cream    Sig: Apply topically 2 (two) times daily. Apply bid for 2 weeks then 2-3 x weekly    Dispense:  30 g    Refill:  3    Order Specific Question:   Supervising Provider    Answer:   Lazaro Arms [[id number]]       Plan:     Return in May for pap and recheck on LSA

## 2024-12-04 ENCOUNTER — Ambulatory Visit: Admitting: Adult Health
